# Patient Record
Sex: Male | Born: 1965 | Race: White | Hispanic: No | Smoking: Never smoker
Health system: Southern US, Community
[De-identification: ages and names within clinical notes are randomized; demographics above are authoritative.]

## PROBLEM LIST (undated history)

## (undated) ENCOUNTER — Ambulatory Visit: Disposition: A | Discharge status: Home / Self Care | Payer: Self-pay

## (undated) DIAGNOSIS — K219 Gastro-esophageal reflux disease without esophagitis: Secondary | ICD-10-CM

## (undated) DIAGNOSIS — J45909 Unspecified asthma, uncomplicated: Secondary | ICD-10-CM

## (undated) DIAGNOSIS — K449 Diaphragmatic hernia without obstruction or gangrene: Secondary | ICD-10-CM

## (undated) DIAGNOSIS — F419 Anxiety disorder, unspecified: Secondary | ICD-10-CM

## (undated) DIAGNOSIS — K222 Esophageal obstruction: Secondary | ICD-10-CM

## (undated) DIAGNOSIS — E559 Vitamin D deficiency, unspecified: Secondary | ICD-10-CM

## (undated) DIAGNOSIS — H93A1 Pulsatile tinnitus, right ear: Secondary | ICD-10-CM

## (undated) HISTORY — DX: Diaphragmatic hernia without obstruction or gangrene: K44.9

## (undated) HISTORY — PX: TOOTH EXTRACTION: SUR596

## (undated) HISTORY — DX: Esophageal obstruction: K22.2

## (undated) HISTORY — DX: Gastro-esophageal reflux disease without esophagitis: K21.9

## (undated) HISTORY — DX: Anxiety disorder, unspecified: F41.9

## (undated) HISTORY — PX: WISDOM TOOTH EXTRACTION: SHX21

## (undated) HISTORY — DX: Unspecified asthma, uncomplicated: J45.909

## (undated) HISTORY — DX: Vitamin D deficiency, unspecified: E55.9

## (undated) HISTORY — DX: Pulsatile tinnitus, right ear: H93.A1

---

## 2014-04-19 ENCOUNTER — Ambulatory Visit: Payer: Self-pay | Admitting: Physician Assistant

## 2014-04-27 ENCOUNTER — Ambulatory Visit (INDEPENDENT_AMBULATORY_CARE_PROVIDER_SITE_OTHER): Payer: BC Managed Care – PPO | Admitting: Physician Assistant

## 2014-04-27 ENCOUNTER — Encounter: Payer: Self-pay | Admitting: Physician Assistant

## 2014-04-27 VITALS — BP 149/87 | HR 53 | Temp 98.0°F | Ht 72.0 in | Wt 210.0 lb

## 2014-04-27 DIAGNOSIS — F411 Generalized anxiety disorder: Secondary | ICD-10-CM

## 2014-04-27 DIAGNOSIS — E559 Vitamin D deficiency, unspecified: Secondary | ICD-10-CM

## 2014-04-27 DIAGNOSIS — K219 Gastro-esophageal reflux disease without esophagitis: Secondary | ICD-10-CM

## 2014-04-27 DIAGNOSIS — Z299 Encounter for prophylactic measures, unspecified: Secondary | ICD-10-CM

## 2014-04-27 LAB — COMPREHENSIVE METABOLIC PANEL
ALT: 17 U/L (ref 0–53)
AST: 24 U/L (ref 0–37)
Albumin: 4.5 g/dL (ref 3.5–5.2)
Alkaline Phosphatase: 51 U/L (ref 39–117)
BUN: 15 mg/dL (ref 6–23)
CO2: 29 meq/L (ref 19–32)
CREATININE: 0.95 mg/dL (ref 0.50–1.35)
Calcium: 8.8 mg/dL (ref 8.4–10.5)
Chloride: 102 mEq/L (ref 96–112)
Glucose, Bld: 73 mg/dL (ref 70–99)
POTASSIUM: 4 meq/L (ref 3.5–5.3)
Sodium: 138 mEq/L (ref 135–145)
TOTAL PROTEIN: 6.7 g/dL (ref 6.0–8.3)
Total Bilirubin: 1.6 mg/dL — ABNORMAL HIGH (ref 0.2–1.2)

## 2014-04-27 LAB — TSH: TSH: 2.449 u[IU]/mL (ref 0.350–4.500)

## 2014-04-27 NOTE — Patient Instructions (Signed)
Please continue medications as directed.  I will call you with your lab results.  Please start taking a daily probiotic to help with increased flatulence and occasional heartburn.  Avoid late-night eating.  We will know more once your lab results are in.  Please read the information below on preventive healthcare for men. At some point, I recommend you come in for a full physical.  Preventive Care for Adults A healthy lifestyle and preventive care can promote health and wellness. Preventive health guidelines for men include the following key practices:  A routine yearly physical is a good way to check with your health care provider about your health and preventative screening. It is a chance to share any concerns and updates on your health and to receive a thorough exam.  Visit your dentist for a routine exam and preventative care every 6 months. Brush your teeth twice a day and floss once a day. Good oral hygiene prevents tooth decay and gum disease.  The frequency of eye exams is based on your age, health, family medical history, use of contact lenses, and other factors. Follow your health care provider's recommendations for frequency of eye exams.  Eat a healthy diet. Foods such as vegetables, fruits, whole grains, low-fat dairy products, and lean protein foods contain the nutrients you need without too many calories. Decrease your intake of foods high in solid fats, added sugars, and salt. Eat the right amount of calories for you.Get information about a proper diet from your health care provider, if necessary.  Regular physical exercise is one of the most important things you can do for your health. Most adults should get at least 150 minutes of moderate-intensity exercise (any activity that increases your heart rate and causes you to sweat) each week. In addition, most adults need muscle-strengthening exercises on 2 or more days a week.  Maintain a healthy weight. The body mass index (BMI) is a  screening tool to identify possible weight problems. It provides an estimate of body fat based on height and weight. Your health care provider can find your BMI and can help you achieve or maintain a healthy weight.For adults 20 years and older:  A BMI below 18.5 is considered underweight.  A BMI of 18.5 to 24.9 is normal.  A BMI of 25 to 29.9 is considered overweight.  A BMI of 30 and above is considered obese.  Maintain normal blood lipids and cholesterol levels by exercising and minimizing your intake of saturated fat. Eat a balanced diet with plenty of fruit and vegetables. Blood tests for lipids and cholesterol should begin at age 62 and be repeated every 5 years. If your lipid or cholesterol levels are high, you are over 50, or you are at high risk for heart disease, you may need your cholesterol levels checked more frequently.Ongoing high lipid and cholesterol levels should be treated with medicines if diet and exercise are not working.  If you smoke, find out from your health care provider how to quit. If you do not use tobacco, do not start.  Lung cancer screening is recommended for adults aged 55-80 years who are at high risk for developing lung cancer because of a history of smoking. A yearly low-dose CT scan of the lungs is recommended for people who have at least a 30-pack-year history of smoking and are a current smoker or have quit within the past 15 years. A pack year of smoking is smoking an average of 1 pack of cigarettes a day  for 1 year (for example: 1 pack a day for 30 years or 2 packs a day for 15 years). Yearly screening should continue until the smoker has stopped smoking for at least 15 years. Yearly screening should be stopped for people who develop a health problem that would prevent them from having lung cancer treatment.  If you choose to drink alcohol, do not have more than 2 drinks per day. One drink is considered to be 12 ounces (355 mL) of beer, 5 ounces (148 mL) of  wine, or 1.5 ounces (44 mL) of liquor.  Avoid use of street drugs. Do not share needles with anyone. Ask for help if you need support or instructions about stopping the use of drugs.  High blood pressure causes heart disease and increases the risk of stroke. Your blood pressure should be checked at least every 1-2 years. Ongoing high blood pressure should be treated with medicines, if weight loss and exercise are not effective.  If you are 35-60 years old, ask your health care provider if you should take aspirin to prevent heart disease.  Diabetes screening involves taking a blood sample to check your fasting blood sugar level. This should be done once every 3 years, after age 20, if you are within normal weight and without risk factors for diabetes. Testing should be considered at a younger age or be carried out more frequently if you are overweight and have at least 1 risk factor for diabetes.  Colorectal cancer can be detected and often prevented. Most routine colorectal cancer screening begins at the age of 104 and continues through age 41. However, your health care provider may recommend screening at an earlier age if you have risk factors for colon cancer. On a yearly basis, your health care provider may provide home test kits to check for hidden blood in the stool. Use of a small camera at the end of a tube to directly examine the colon (sigmoidoscopy or colonoscopy) can detect the earliest forms of colorectal cancer. Talk to your health care provider about this at age 68, when routine screening begins. Direct exam of the colon should be repeated every 5-10 years through age 25, unless early forms of precancerous polyps or small growths are found.  People who are at an increased risk for hepatitis B should be screened for this virus. You are considered at high risk for hepatitis B if:  You were born in a country where hepatitis B occurs often. Talk with your health care provider about which  countries are considered high risk.  Your parents were born in a high-risk country and you have not received a shot to protect against hepatitis B (hepatitis B vaccine).  You have HIV or AIDS.  You use needles to inject street drugs.  You live with, or have sex with, someone who has hepatitis B.  You are a man who has sex with other men (MSM).  You get hemodialysis treatment.  You take certain medicines for conditions such as cancer, organ transplantation, and autoimmune conditions.  Hepatitis C blood testing is recommended for all people born from [birth year] through 1965 and any individual with known risks for hepatitis C.  Practice safe sex. Use condoms and avoid high-risk sexual practices to reduce the spread of sexually transmitted infections (STIs). STIs include gonorrhea, chlamydia, syphilis, trichomonas, herpes, HPV, and human immunodeficiency virus (HIV). Herpes, HIV, and HPV are viral illnesses that have no cure. They can result in disability, cancer, and death.  If you  are at risk of being infected with HIV, it is recommended that you take a prescription medicine daily to prevent HIV infection. This is called preexposure prophylaxis (PrEP). You are considered at risk if:  You are a man who has sex with other men (MSM) and have other risk factors.  You are a heterosexual man, are sexually active, and are at increased risk for HIV infection.  You take drugs by injection.  You are sexually active with a partner who has HIV.  Talk with your health care provider about whether you are at high risk of being infected with HIV. If you choose to begin PrEP, you should first be tested for HIV. You should then be tested every 3 months for as long as you are taking PrEP.  A one-time screening for abdominal aortic aneurysm (AAA) and surgical repair of large AAAs by ultrasound are recommended for men ages 61 to 42 years who are current or former smokers.  Healthy men should no longer receive  prostate-specific antigen (PSA) blood tests as part of routine cancer screening. Talk with your health care provider about prostate cancer screening.  Testicular cancer screening is not recommended for adult males who have no symptoms. Screening includes self-exam, a health care provider exam, and other screening tests. Consult with your health care provider about any symptoms you have or any concerns you have about testicular cancer.  Use sunscreen. Apply sunscreen liberally and repeatedly throughout the day. You should seek shade when your shadow is shorter than you. Protect yourself by wearing long sleeves, pants, a wide-brimmed hat, and sunglasses year round, whenever you are outdoors.  Once a month, do a whole-body skin exam, using a mirror to look at the skin on your back. Tell your health care provider about new moles, moles that have irregular borders, moles that are larger than a pencil eraser, or moles that have changed in shape or color.  Stay current with required vaccines (immunizations).  Influenza vaccine. All adults should be immunized every year.  Tetanus, diphtheria, and acellular pertussis (Td, Tdap) vaccine. An adult who has not previously received Tdap or who does not know his vaccine status should receive 1 dose of Tdap. This initial dose should be followed by tetanus and diphtheria toxoids (Td) booster doses every 10 years. Adults with an unknown or incomplete history of completing a 3-dose immunization series with Td-containing vaccines should begin or complete a primary immunization series including a Tdap dose. Adults should receive a Td booster every 10 years.  Varicella vaccine. An adult without evidence of immunity to varicella should receive 2 doses or a second dose if he has previously received 1 dose.  Human papillomavirus (HPV) vaccine. Males aged 60-21 years who have not received the vaccine previously should receive the 3-dose series. Males aged 22-26 years may be  immunized. Immunization is recommended through the age of 43 years for any male who has sex with males and did not get any or all doses earlier. Immunization is recommended for any person with an immunocompromised condition through the age of 34 years if he did not get any or all doses earlier. During the 3-dose series, the second dose should be obtained 4-8 weeks after the first dose. The third dose should be obtained 24 weeks after the first dose and 16 weeks after the second dose.  Zoster vaccine. One dose is recommended for adults aged 32 years or older unless certain conditions are present.  Measles, mumps, and rubella (MMR) vaccine. Adults  born before [birth year] generally are considered immune to measles and mumps. Adults born in [birth year] or later should have 1 or more doses of MMR vaccine unless there is a contraindication to the vaccine or there is laboratory evidence of immunity to each of the three diseases. A routine second dose of MMR vaccine should be obtained at least 28 days after the first dose for students attending postsecondary schools, health care workers, or international travelers. People who received inactivated measles vaccine or an unknown type of measles vaccine during 1963-1967 should receive 2 doses of MMR vaccine. People who received inactivated mumps vaccine or an unknown type of mumps vaccine before 1979 and are at high risk for mumps infection should consider immunization with 2 doses of MMR vaccine. Unvaccinated health care workers born before [birth year] who lack laboratory evidence of measles, mumps, or rubella immunity or laboratory confirmation of disease should consider measles and mumps immunization with 2 doses of MMR vaccine or rubella immunization with 1 dose of MMR vaccine.  Pneumococcal 13-valent conjugate (PCV13) vaccine. When indicated, a person who is uncertain of his immunization history and has no record of immunization should receive the PCV13 vaccine. An adult aged 60 years or  older who has certain medical conditions and has not been previously immunized should receive 1 dose of PCV13 vaccine. This PCV13 should be followed with a dose of pneumococcal polysaccharide (PPSV23) vaccine. The PPSV23 vaccine dose should be obtained at least 8 weeks after the dose of PCV13 vaccine. An adult aged 50 years or older who has certain medical conditions and previously received 1 or more doses of PPSV23 vaccine should receive 1 dose of PCV13. The PCV13 vaccine dose should be obtained 1 or more years after the last PPSV23 vaccine dose.  Pneumococcal polysaccharide (PPSV23) vaccine. When PCV13 is also indicated, PCV13 should be obtained first. All adults aged 38 years and older should be immunized. An adult younger than age 30 years who has certain medical conditions should be immunized. Any person who resides in a nursing home or long-term care facility should be immunized. An adult smoker should be immunized. People with an immunocompromised condition and certain other conditions should receive both PCV13 and PPSV23 vaccines. People with human immunodeficiency virus (HIV) infection should be immunized as soon as possible after diagnosis. Immunization during chemotherapy or radiation therapy should be avoided. Routine use of PPSV23 vaccine is not recommended for American Indians, [place] Natives, or people younger than 65 years unless there are medical conditions that require PPSV23 vaccine. When indicated, people who have unknown immunization and have no record of immunization should receive PPSV23 vaccine. One-time revaccination 5 years after the first dose of PPSV23 is recommended for people aged 19-64 years who have chronic kidney failure, nephrotic syndrome, asplenia, or immunocompromised conditions. People who received 1-2 doses of PPSV23 before age 34 years should receive another dose of PPSV23 vaccine at age 63 years or later if at least 5 years have passed since the previous dose. Doses of  PPSV23 are not needed for people immunized with PPSV23 at or after age 53 years.  Meningococcal vaccine. Adults with asplenia or persistent complement component deficiencies should receive 2 doses of quadrivalent meningococcal conjugate (MenACWY-D) vaccine. The doses should be obtained at least 2 months apart. Microbiologists working with certain meningococcal bacteria, [employer] recruits, people at risk during an outbreak, and people who travel to or live in countries with a high rate of meningitis should be immunized. A first-year college student up through age  21 years who is living in a residence hall should receive a dose if he did not receive a dose on or after his 16th birthday. Adults who have certain high-risk conditions should receive one or more doses of vaccine.  Hepatitis A vaccine. Adults who wish to be protected from this disease, have certain high-risk conditions, work with hepatitis A-infected animals, work in hepatitis A research labs, or travel to or work in countries with a high rate of hepatitis A should be immunized. Adults who were previously unvaccinated and who anticipate close contact with an international adoptee during the first 60 days after arrival in the [place] from a country with a high rate of hepatitis A should be immunized.  Hepatitis B vaccine. Adults should be immunized if they wish to be protected from this disease, have certain high-risk conditions, may be exposed to blood or other infectious body fluids, are household contacts or sex partners of hepatitis B positive people, are clients or workers in certain care facilities, or travel to or work in countries with a high rate of hepatitis B.  Haemophilus influenzae type b (Hib) vaccine. A previously unvaccinated person with asplenia or sickle cell disease or having a scheduled splenectomy should receive 1 dose of Hib vaccine. Regardless of previous immunization, a recipient of a hematopoietic stem cell transplant  should receive a 3-dose series 6-12 months after his successful transplant. Hib vaccine is not recommended for adults with HIV infection. Preventive Service / Frequency Ages 64 to 22  Blood pressure check.** / Every 1 to 2 years.  Lipid and cholesterol check.** / Every 5 years beginning at age 26.  Hepatitis C blood test.** / For any individual with known risks for hepatitis C.  Skin self-exam. / Monthly.  Influenza vaccine. / Every year.  Tetanus, diphtheria, and acellular pertussis (Tdap, Td) vaccine.** / Consult your health care provider. 1 dose of Td every 10 years.  Varicella vaccine.** / Consult your health care provider.  HPV vaccine. / 3 doses over 6 months, if 18 or younger.  Measles, mumps, rubella (MMR) vaccine.** / You need at least 1 dose of MMR if you were born in 1957 or later. You may also need a second dose.  Pneumococcal 13-valent conjugate (PCV13) vaccine.** / Consult your health care provider.  Pneumococcal polysaccharide (PPSV23) vaccine.** / 1 to 2 doses if you smoke cigarettes or if you have certain conditions.  Meningococcal vaccine.** / 1 dose if you are age 51 to 43 years and a Market researcher living in a residence hall, or have one of several medical conditions. You may also need additional booster doses.  Hepatitis A vaccine.** / Consult your health care provider.  Hepatitis B vaccine.** / Consult your health care provider.  Haemophilus influenzae type b (Hib) vaccine.** / Consult your health care provider. Ages 68 to 62  Blood pressure check.** / Every 1 to 2 years.  Lipid and cholesterol check.** / Every 5 years beginning at age 86.  Lung cancer screening. / Every year if you are aged 12-80 years and have a 30-pack-year history of smoking and currently smoke or have quit within the past 15 years. Yearly screening is stopped once you have quit smoking for at least 15 years or develop a health problem that would prevent you from having  lung cancer treatment.  Fecal occult blood test (FOBT) of stool. / Every year beginning at age 20 and continuing until age 70. You may not have to do this test if  you get a colonoscopy every 10 years.  Flexible sigmoidoscopy** or colonoscopy.** / Every 5 years for a flexible sigmoidoscopy or every 10 years for a colonoscopy beginning at age 21 and continuing until age 43.  Hepatitis C blood test.** / For all people born from [birth year] through 1965 and any individual with known risks for hepatitis C.  Skin self-exam. / Monthly.  Influenza vaccine. / Every year.  Tetanus, diphtheria, and acellular pertussis (Tdap/Td) vaccine.** / Consult your health care provider. 1 dose of Td every 10 years.  Varicella vaccine.** / Consult your health care provider.  Zoster vaccine.** / 1 dose for adults aged 69 years or older.  Measles, mumps, rubella (MMR) vaccine.** / You need at least 1 dose of MMR if you were born in 1957 or later. You may also need a second dose.  Pneumococcal 13-valent conjugate (PCV13) vaccine.** / Consult your health care provider.  Pneumococcal polysaccharide (PPSV23) vaccine.** / 1 to 2 doses if you smoke cigarettes or if you have certain conditions.  Meningococcal vaccine.** / Consult your health care provider.  Hepatitis A vaccine.** / Consult your health care provider.  Hepatitis B vaccine.** / Consult your health care provider.  Haemophilus influenzae type b (Hib) vaccine.** / Consult your health care provider. Ages 50 and over  Blood pressure check.** / Every 1 to 2 years.  Lipid and cholesterol check.**/ Every 5 years beginning at age 74.  Lung cancer screening. / Every year if you are aged 18-80 years and have a 30-pack-year history of smoking and currently smoke or have quit within the past 15 years. Yearly screening is stopped once you have quit smoking for at least 15 years or develop a health problem that would prevent you from having lung cancer  treatment.  Fecal occult blood test (FOBT) of stool. / Every year beginning at age 72 and continuing until age 36. You may not have to do this test if you get a colonoscopy every 10 years.  Flexible sigmoidoscopy** or colonoscopy.** / Every 5 years for a flexible sigmoidoscopy or every 10 years for a colonoscopy beginning at age 42 and continuing until age 57.  Hepatitis C blood test.** / For all people born from [birth year] through 1965 and any individual with known risks for hepatitis C.  Abdominal aortic aneurysm (AAA) screening.** / A one-time screening for ages 76 to 54 years who are current or former smokers.  Skin self-exam. / Monthly.  Influenza vaccine. / Every year.  Tetanus, diphtheria, and acellular pertussis (Tdap/Td) vaccine.** / 1 dose of Td every 10 years.  Varicella vaccine.** / Consult your health care provider.  Zoster vaccine.** / 1 dose for adults aged 84 years or older.  Pneumococcal 13-valent conjugate (PCV13) vaccine.** / Consult your health care provider.  Pneumococcal polysaccharide (PPSV23) vaccine.** / 1 dose for all adults aged 76 years and older.  Meningococcal vaccine.** / Consult your health care provider.  Hepatitis A vaccine.** / Consult your health care provider.  Hepatitis B vaccine.** / Consult your health care provider.  Haemophilus influenzae type b (Hib) vaccine.** / Consult your health care provider. **Family history and personal history of risk and conditions may change your health care provider's recommendations. Document Released: 09/29/2001 Document Revised: 08/08/2013 Document Reviewed: 12/29/2010 [facility] Patient Information 2015 [place], [place]. This information is not intended to replace advice given to you by your health care provider. Make sure you discuss any questions you have with your health care provider.

## 2014-04-27 NOTE — Progress Notes (Signed)
Patient presents to clinic today to establish care.  Acute Concerns: Patient wishes to have his blood sugar levels checked. Endorses family history of diabetes.  Patient denies personal history of elevated glucose.  Denies urinary frequency, polydipsia, polyuria or polyphagia.  Denies nausea/vomiting or dizziness.    Chronic Issues: GERD -- well-controlled with current regimen.  Notes recurrence of symptoms if he skips a dose of medication. Denies nausea/vomiting or abdominal pain.  Asthma -- controlled with combination of Advair BID and Albuterol prn.  Denies nighttime symptoms.  Requires Albuterol a few times per week.    Hypothyroidism -- patient endorses history of underactive thyroid diagnosed several years ago.  States he has never been treated for this.  Health Maintenance: Dental -- up-to-date Vision -- up-to-date Immunizations -- Declines flu shot. Endorses Tetanus last in 2012.  Past Medical History  Diagnosis Date  . Asthma     History reviewed. No pertinent past surgical history.  No current outpatient prescriptions on file prior to visit.   No current facility-administered medications on file prior to visit.    Allergies  Allergen Reactions  . Shellfish Allergy     Family History  Problem Relation Age of Onset  . Diabetes Mother   . Hypertension Father   . Heart disease Father   . Diabetes Brother   . Diabetes Maternal Grandmother   . Cancer Paternal Grandfather     Brain Tumor    History   Social History  . Marital Status: Legally Separated    Spouse Name: N/A    Number of Children: N/A  . Years of Education: N/A   Occupational History  . Not on file.   Social History Main Topics  . Smoking status: Never Smoker   . Smokeless tobacco: Not on file  . Alcohol Use: No  . Drug Use: No  . Sexual Activity: No   Other Topics Concern  . Not on file   Social History Narrative  . No narrative on file   ROS See HPI.  All other ROS are  negative.  BP 149/87  Pulse 53  Temp(Src) 98 F (36.7 C)  Ht 6' (1.829 m)  Wt 210 lb (95.255 kg)  BMI 28.47 kg/m2  SpO2 98%  Physical Exam  Vitals reviewed. Constitutional: He is oriented to person, place, and time and well-developed, well-nourished, and in no distress.  HENT:  Head: Normocephalic and atraumatic.  Right Ear: External ear normal.  Left Ear: External ear normal.  Nose: Nose normal.  Mouth/Throat: Oropharynx is clear and moist. No oropharyngeal exudate.  TM within normal limits bilaterally.  Eyes: Conjunctivae are normal. Pupils are equal, round, and reactive to light.  Neck: Neck supple. No thyromegaly present.  Cardiovascular: Normal rate, regular rhythm, normal heart sounds and intact distal pulses.   Pulmonary/Chest: Effort normal and breath sounds normal. No respiratory distress. He has no wheezes. He has no rales. He exhibits no tenderness.  Abdominal: Soft. Bowel sounds are normal. He exhibits no distension and no mass. There is no tenderness. There is no rebound and no guarding.  Lymphadenopathy:    He has no cervical adenopathy.  Neurological: He is alert and oriented to person, place, and time.  Skin: Skin is warm and dry. No rash noted.  Psychiatric: Affect normal.    Recent Results (from the past 2160 hour(s))  CBC WITH DIFFERENTIAL     Status: None   Collection Time    04/27/14  4:23 PM      Result  Value Ref Range   WBC 5.6  4.0 - 10.5 K/uL   RBC 4.44  4.22 - 5.81 MIL/uL   Hemoglobin 14.2  13.0 - 17.0 g/dL   HCT 42.7  39.0 - 52.0 %   MCV 96.2  78.0 - 100.0 fL   MCH 32.0  26.0 - 34.0 pg   MCHC 33.3  30.0 - 36.0 g/dL   RDW 13.4  11.5 - 15.5 %   Platelets 177  150 - 400 K/uL   Neutrophils Relative % 71  43 - 77 %   Neutro Abs 4.0  1.7 - 7.7 K/uL   Lymphocytes Relative 21  12 - 46 %   Lymphs Abs 1.2  0.7 - 4.0 K/uL   Monocytes Relative 5  3 - 12 %   Monocytes Absolute 0.3  0.1 - 1.0 K/uL   Eosinophils Relative 2  0 - 5 %   Eosinophils  Absolute 0.1  0.0 - 0.7 K/uL   Basophils Relative 1  0 - 1 %   Basophils Absolute 0.1  0.0 - 0.1 K/uL   Smear Review Criteria for review not met    COMPREHENSIVE METABOLIC PANEL     Status: Abnormal   Collection Time    04/27/14  4:23 PM      Result Value Ref Range   Sodium 138  135 - 145 mEq/L   Potassium 4.0  3.5 - 5.3 mEq/L   Chloride 102  96 - 112 mEq/L   CO2 29  19 - 32 mEq/L   Glucose, Bld 73  70 - 99 mg/dL   BUN 15  6 - 23 mg/dL   Creat 0.95  0.50 - 1.35 mg/dL   Total Bilirubin 1.6 (*) 0.2 - 1.2 mg/dL   Alkaline Phosphatase 51  39 - 117 U/L   AST 24  0 - 37 U/L   ALT 17  0 - 53 U/L   Total Protein 6.7  6.0 - 8.3 g/dL   Albumin 4.5  3.5 - 5.2 g/dL   Calcium 8.8  8.4 - 10.5 mg/dL  HEMOGLOBIN A1C     Status: None   Collection Time    04/27/14  4:23 PM      Result Value Ref Range   Hemoglobin A1C 5.5  <5.7 %   Comment:                                                                            According to the ADA Clinical Practice Recommendations for 2011, when     HbA1c is used as a screening test:             >=6.5%   Diagnostic of Diabetes Mellitus                (if abnormal result is confirmed)           5.7-6.4%   Increased risk of developing Diabetes Mellitus           References:Diagnosis and Classification of Diabetes Mellitus,Diabetes     IPJA,2505,39(JQBHA 1):S62-S69 and Standards of Medical Care in             Diabetes - 2011,Diabetes Care,2011,34 (Suppl 1):S11-S61.  Mean Plasma Glucose 111  <117 mg/dL  TSH     Status: None   Collection Time    04/27/14  4:23 PM      Result Value Ref Range   TSH 2.449  0.350 - 4.500 uIU/mL  URINALYSIS, ROUTINE W REFLEX MICROSCOPIC     Status: None   Collection Time    04/27/14  4:23 PM      Result Value Ref Range   Color, Urine YELLOW  YELLOW   APPearance CLEAR  CLEAR   Specific Gravity, Urine 1.020  1.005 - 1.030   pH 5.5  5.0 - 8.0   Glucose, UA NEG  NEG mg/dL   Bilirubin Urine NEG  NEG   Ketones, ur NEG   NEG mg/dL   Hgb urine dipstick NEG  NEG   Protein, ur NEG  NEG mg/dL   Urobilinogen, UA 0.2  0.0 - 1.0 mg/dL   Nitrite NEG  NEG   Leukocytes, UA NEG  NEG  VITAMIN D 25 HYDROXY     Status: None   Collection Time    04/27/14  4:23 PM      Result Value Ref Range   Vit D, 25-Hydroxy 39  30 - 89 ng/mL   Comment: This assay accurately quantifies Vitamin D, which is the sum of the     25-Hydroxy forms of Vitamin D2 and D3.  Studies have shown that the     optimum concentration of 25-Hydroxy Vitamin D is 30 ng/mL or higher.      Concentrations of Vitamin D between 20 and 29 ng/mL are considered to     be insufficient and concentrations less than 20 ng/mL are considered     to be deficient for Vitamin D.    Assessment/Plan: Gastroesophageal reflux disease without esophagitis Doing well.  Continue current regimen.  Add daily probiotic to help with bloating and flatulence.  Anxiety state, unspecified Will recheck TSH level.  Patient to give thought to counseling or medications.  Hypovitaminosis D Will check Vitamin D level.  Preventive measure Will check blood glucose level and electrolytes. Will also check CBC to establish baseline.

## 2014-04-27 NOTE — Progress Notes (Signed)
Pre visit review using our clinic review tool, if applicable. No additional management support is needed unless otherwise documented below in the visit note. 

## 2014-04-28 LAB — URINALYSIS, ROUTINE W REFLEX MICROSCOPIC
BILIRUBIN URINE: NEGATIVE
GLUCOSE, UA: NEGATIVE mg/dL
Hgb urine dipstick: NEGATIVE
Ketones, ur: NEGATIVE mg/dL
Leukocytes, UA: NEGATIVE
Nitrite: NEGATIVE
PH: 5.5 (ref 5.0–8.0)
PROTEIN: NEGATIVE mg/dL
Specific Gravity, Urine: 1.02 (ref 1.005–1.030)
Urobilinogen, UA: 0.2 mg/dL (ref 0.0–1.0)

## 2014-04-28 LAB — CBC WITH DIFFERENTIAL/PLATELET
BASOS PCT: 1 % (ref 0–1)
Basophils Absolute: 0.1 10*3/uL (ref 0.0–0.1)
Eosinophils Absolute: 0.1 10*3/uL (ref 0.0–0.7)
Eosinophils Relative: 2 % (ref 0–5)
HCT: 42.7 % (ref 39.0–52.0)
HEMOGLOBIN: 14.2 g/dL (ref 13.0–17.0)
LYMPHS PCT: 21 % (ref 12–46)
Lymphs Abs: 1.2 10*3/uL (ref 0.7–4.0)
MCH: 32 pg (ref 26.0–34.0)
MCHC: 33.3 g/dL (ref 30.0–36.0)
MCV: 96.2 fL (ref 78.0–100.0)
MONO ABS: 0.3 10*3/uL (ref 0.1–1.0)
MONOS PCT: 5 % (ref 3–12)
NEUTROS ABS: 4 10*3/uL (ref 1.7–7.7)
Neutrophils Relative %: 71 % (ref 43–77)
Platelets: 177 10*3/uL (ref 150–400)
RBC: 4.44 MIL/uL (ref 4.22–5.81)
RDW: 13.4 % (ref 11.5–15.5)
WBC: 5.6 10*3/uL (ref 4.0–10.5)

## 2014-04-28 LAB — VITAMIN D 25 HYDROXY (VIT D DEFICIENCY, FRACTURES): VIT D 25 HYDROXY: 39 ng/mL (ref 30–89)

## 2014-04-29 LAB — HEMOGLOBIN A1C
HEMOGLOBIN A1C: 5.5 % (ref <5.7)
Mean Plasma Glucose: 111 mg/dL (ref <117)

## 2014-05-13 DIAGNOSIS — E559 Vitamin D deficiency, unspecified: Secondary | ICD-10-CM | POA: Insufficient documentation

## 2014-05-13 DIAGNOSIS — Z299 Encounter for prophylactic measures, unspecified: Secondary | ICD-10-CM | POA: Insufficient documentation

## 2014-05-13 DIAGNOSIS — F411 Generalized anxiety disorder: Secondary | ICD-10-CM | POA: Insufficient documentation

## 2014-05-13 DIAGNOSIS — K219 Gastro-esophageal reflux disease without esophagitis: Secondary | ICD-10-CM | POA: Insufficient documentation

## 2014-05-13 NOTE — Assessment & Plan Note (Signed)
Will check blood glucose level and electrolytes. Will also check CBC to establish baseline.

## 2014-05-13 NOTE — Assessment & Plan Note (Signed)
Will check Vitamin D level

## 2014-05-13 NOTE — Assessment & Plan Note (Signed)
Will recheck TSH level.  Patient to give thought to counseling or medications.

## 2014-05-13 NOTE — Assessment & Plan Note (Signed)
Doing well.  Continue current regimen.  Add daily probiotic to help with bloating and flatulence.

## 2014-05-16 ENCOUNTER — Encounter: Payer: Self-pay | Admitting: Physician Assistant

## 2014-05-16 ENCOUNTER — Ambulatory Visit (INDEPENDENT_AMBULATORY_CARE_PROVIDER_SITE_OTHER): Payer: BC Managed Care – PPO | Admitting: Physician Assistant

## 2014-05-16 VITALS — BP 128/76 | HR 50 | Temp 97.7°F | Resp 16 | Ht 72.0 in | Wt 209.0 lb

## 2014-05-16 DIAGNOSIS — K219 Gastro-esophageal reflux disease without esophagitis: Secondary | ICD-10-CM

## 2014-05-16 DIAGNOSIS — M79672 Pain in left foot: Secondary | ICD-10-CM

## 2014-05-16 DIAGNOSIS — M79671 Pain in right foot: Secondary | ICD-10-CM

## 2014-05-16 DIAGNOSIS — M79609 Pain in unspecified limb: Secondary | ICD-10-CM

## 2014-05-16 NOTE — Assessment & Plan Note (Signed)
Continue daily probiotic.  Zantac daily.  Avoid trigger foods. Elevate HOB.

## 2014-05-16 NOTE — Progress Notes (Signed)
Pre visit review using our clinic review tool, if applicable. No additional management support is needed unless otherwise documented below in the visit note/SLS  

## 2014-05-16 NOTE — Patient Instructions (Signed)
Please continue probiotic daily. Take a zantac each day before your run.  For allergies -- continue Flonase daily.  Add a daily claritin.  Increase fluid intake.  You will be contacted by the Podiatrist for an appointment.   Follow-up in 3 months.

## 2014-05-16 NOTE — Progress Notes (Signed)
Patient presents to clinic today for follow-up of acid reflux and bloating symptoms.  Patient has been taking a probiotic daily with marked improvement in symptoms.  Bloating has dissipated.  Flatulence much decreased.  Some residual heartburn noted after use of inhalers for asthma. Denies N/V, epigastric pain.  Past Medical History  Diagnosis Date  . Asthma     Current Outpatient Prescriptions on File Prior to Visit  Medication Sig Dispense Refill  . ADVAIR DISKUS 100-50 MCG/DOSE AEPB Inhale 1 puff into the lungs 2 (two) times daily.       . fluticasone (FLONASE) 50 MCG/ACT nasal spray       . VENTOLIN HFA 108 (90 BASE) MCG/ACT inhaler Inhale into the lungs 2 (two) times daily as needed.       . Vitamin D, Ergocalciferol, (DRISDOL) 50000 UNITS CAPS capsule Take 50,000 Units by mouth every 7 (seven) days.        No current facility-administered medications on file prior to visit.    Allergies  Allergen Reactions  . Dust Mite Extract   . Other     Grass, Mold, Mildew  . Shellfish Allergy     Family History  Problem Relation Age of Onset  . Diabetes Mother   . Hypertension Father   . Heart disease Father   . Diabetes Brother   . Diabetes Maternal Grandmother   . Cancer Paternal Grandfather     Brain Tumor    History   Social History  . Marital Status: Legally Separated    Spouse Name: N/A    Number of Children: N/A  . Years of Education: N/A   Social History Main Topics  . Smoking status: Never Smoker   . Smokeless tobacco: None  . Alcohol Use: No  . Drug Use: No  . Sexual Activity: No   Other Topics Concern  . None   Social History Narrative  . None    Review of Systems - See HPI.  All other ROS are negative.  BP 128/76  Pulse 50  Temp(Src) 97.7 F (36.5 C) (Oral)  Resp 16  Ht 6' (1.829 m)  Wt 209 lb (94.802 kg)  BMI 28.34 kg/m2  SpO2 100%  Physical Exam  Vitals reviewed. Constitutional: He is well-developed, well-nourished, and in no  distress.  HENT:  Head: Normocephalic and atraumatic.  Eyes: Conjunctivae are normal.  Cardiovascular: Normal rate, regular rhythm, normal heart sounds and intact distal pulses.   Pulmonary/Chest: Effort normal and breath sounds normal. No respiratory distress. He has no wheezes. He has no rales. He exhibits no tenderness.  Abdominal: Soft. Bowel sounds are normal. He exhibits no distension. There is no tenderness.  Psychiatric: Affect normal.    Recent Results (from the past 2160 hour(s))  CBC WITH DIFFERENTIAL     Status: None   Collection Time    04/27/14  4:23 PM      Result Value Ref Range   WBC 5.6  4.0 - 10.5 K/uL   RBC 4.44  4.22 - 5.81 MIL/uL   Hemoglobin 14.2  13.0 - 17.0 g/dL   HCT 42.7  39.0 - 52.0 %   MCV 96.2  78.0 - 100.0 fL   MCH 32.0  26.0 - 34.0 pg   MCHC 33.3  30.0 - 36.0 g/dL   RDW 13.4  11.5 - 15.5 %   Platelets 177  150 - 400 K/uL   Neutrophils Relative % 71  43 - 77 %   Neutro Abs  4.0  1.7 - 7.7 K/uL   Lymphocytes Relative 21  12 - 46 %   Lymphs Abs 1.2  0.7 - 4.0 K/uL   Monocytes Relative 5  3 - 12 %   Monocytes Absolute 0.3  0.1 - 1.0 K/uL   Eosinophils Relative 2  0 - 5 %   Eosinophils Absolute 0.1  0.0 - 0.7 K/uL   Basophils Relative 1  0 - 1 %   Basophils Absolute 0.1  0.0 - 0.1 K/uL   Smear Review Criteria for review not met    COMPREHENSIVE METABOLIC PANEL     Status: Abnormal   Collection Time    04/27/14  4:23 PM      Result Value Ref Range   Sodium 138  135 - 145 mEq/L   Potassium 4.0  3.5 - 5.3 mEq/L   Chloride 102  96 - 112 mEq/L   CO2 29  19 - 32 mEq/L   Glucose, Bld 73  70 - 99 mg/dL   BUN 15  6 - 23 mg/dL   Creat 0.95  0.50 - 1.35 mg/dL   Total Bilirubin 1.6 (*) 0.2 - 1.2 mg/dL   Alkaline Phosphatase 51  39 - 117 U/L   AST 24  0 - 37 U/L   ALT 17  0 - 53 U/L   Total Protein 6.7  6.0 - 8.3 g/dL   Albumin 4.5  3.5 - 5.2 g/dL   Calcium 8.8  8.4 - 10.5 mg/dL  HEMOGLOBIN A1C     Status: None   Collection Time    04/27/14  4:23 PM       Result Value Ref Range   Hemoglobin A1C 5.5  <5.7 %   Comment:                                                                            According to the ADA Clinical Practice Recommendations for 2011, when     HbA1c is used as a screening test:             >=6.5%   Diagnostic of Diabetes Mellitus                (if abnormal result is confirmed)           5.7-6.4%   Increased risk of developing Diabetes Mellitus           References:Diagnosis and Classification of Diabetes Mellitus,Diabetes     VWUJ,8119,14(NWGNF 1):S62-S69 and Standards of Medical Care in             Diabetes - 2011,Diabetes Care,2011,34 (Suppl 1):S11-S61.         Mean Plasma Glucose 111  <117 mg/dL  TSH     Status: None   Collection Time    04/27/14  4:23 PM      Result Value Ref Range   TSH 2.449  0.350 - 4.500 uIU/mL  URINALYSIS, ROUTINE W REFLEX MICROSCOPIC     Status: None   Collection Time    04/27/14  4:23 PM      Result Value Ref Range   Color, Urine YELLOW  YELLOW   APPearance CLEAR  CLEAR   Specific Gravity, Urine  1.020  1.005 - 1.030   pH 5.5  5.0 - 8.0   Glucose, UA NEG  NEG mg/dL   Bilirubin Urine NEG  NEG   Ketones, ur NEG  NEG mg/dL   Hgb urine dipstick NEG  NEG   Protein, ur NEG  NEG mg/dL   Urobilinogen, UA 0.2  0.0 - 1.0 mg/dL   Nitrite NEG  NEG   Leukocytes, UA NEG  NEG  VITAMIN D 25 HYDROXY     Status: None   Collection Time    04/27/14  4:23 PM      Result Value Ref Range   Vit D, 25-Hydroxy 39  30 - 89 ng/mL   Comment: This assay accurately quantifies Vitamin D, which is the sum of the     25-Hydroxy forms of Vitamin D2 and D3.  Studies have shown that the     optimum concentration of 25-Hydroxy Vitamin D is 30 ng/mL or higher.      Concentrations of Vitamin D between 20 and 29 ng/mL are considered to     be insufficient and concentrations less than 20 ng/mL are considered     to be deficient for Vitamin D.    Assessment/Plan: Gastroesophageal reflux disease without  esophagitis Continue daily probiotic.  Zantac daily.  Avoid trigger foods. Elevate HOB.

## 2014-05-30 ENCOUNTER — Ambulatory Visit (INDEPENDENT_AMBULATORY_CARE_PROVIDER_SITE_OTHER): Payer: BC Managed Care – PPO | Admitting: Podiatry

## 2014-05-30 ENCOUNTER — Encounter: Payer: Self-pay | Admitting: Podiatry

## 2014-05-30 ENCOUNTER — Ambulatory Visit (INDEPENDENT_AMBULATORY_CARE_PROVIDER_SITE_OTHER): Payer: BC Managed Care – PPO

## 2014-05-30 VITALS — BP 108/70 | HR 54 | Resp 16 | Ht 72.0 in | Wt 208.0 lb

## 2014-05-30 DIAGNOSIS — M2040 Other hammer toe(s) (acquired), unspecified foot: Secondary | ICD-10-CM

## 2014-05-30 DIAGNOSIS — M779 Enthesopathy, unspecified: Secondary | ICD-10-CM

## 2014-05-30 NOTE — Progress Notes (Signed)
   Subjective:    Patient ID: Francisco Morrison, male    DOB: 07/23/1966, 48 y.o.   MRN: [id number]  HPI Comments: "I don't have any major problems other than needing new inserts"  Patient would like to get new orthotics. He's had his current ones for 3 years. He is an active runner. He does have a corn on 2nd toe left. Also, sometimes feels a "strain" lateral ankle right.  Foot Pain      Review of Systems  HENT: Positive for sinus pressure.   All other systems reviewed and are negative.      Objective:   Physical Exam        Assessment & Plan:

## 2014-05-31 NOTE — Progress Notes (Signed)
Subjective:     Patient ID: Francisco Morrison, male   DOB: [birth year], 48 y.o.   MRN: [id number]  HPI patient presents stating I'm a runner and my orthotics have worn out and I need new pair   Review of Systems  All other systems reviewed and are negative.      Objective:   Physical Exam  Nursing note and vitals reviewed. Constitutional: He is oriented to person, place, and time.  Cardiovascular: Intact distal pulses.   Musculoskeletal: Normal range of motion.  Neurological: He is oriented to person, place, and time.  Skin: Skin is warm.   neurovascular status intact with muscle strength adequate and range of motion subtalar and midtarsal joint within normal limits. Patient is noted to have moderate depression of the arch and discomfort in the arch of a low-grade nature secondary to activity levels. Digits are well-perfused she is well oriented x3     Assessment:     Moderate plantar fasciitis secondary to foot structure of both feet    Plan:     H&P performed and conditions discussed. Recommended new orthotics which were scanned today and discussed physical therapy and stretching exercises. Reappoint when orthotics are ready

## 2014-06-22 ENCOUNTER — Other Ambulatory Visit: Payer: BC Managed Care – PPO

## 2014-07-03 ENCOUNTER — Other Ambulatory Visit: Payer: Self-pay | Admitting: Physician Assistant

## 2014-07-03 NOTE — Telephone Encounter (Signed)
Rx request to pharmacy/SLS Vit D2 Denied, as patient lab results does not warrant this therapy/SLS

## 2014-07-26 ENCOUNTER — Ambulatory Visit (INDEPENDENT_AMBULATORY_CARE_PROVIDER_SITE_OTHER): Payer: BC Managed Care – PPO | Admitting: Podiatry

## 2014-07-26 ENCOUNTER — Encounter: Payer: Self-pay | Admitting: Podiatry

## 2014-07-26 DIAGNOSIS — M779 Enthesopathy, unspecified: Secondary | ICD-10-CM

## 2014-07-26 NOTE — Patient Instructions (Signed)

## 2014-07-28 NOTE — Progress Notes (Signed)
Subjective:     Patient ID: Francisco Morrison, male   DOB: [birth year], 48 y.o.   MRN: [id number]  HPI patient states my heels are sore but they are improved quite a bit from previous visits   Review of Systems     Objective:   Physical Exam Neurovascular status intact with moderate discomfort plantar aspect heel at the insertional of the tendon into the calcaneus bilateral    Assessment:     Continued plantar fasciitis with moderate depression of the arch as a contributing factor    Plan:     Dispensed orthotics with instructions on usage and reviewed shoe gear modifications and gradual increase in activity. Reappoint in 6 weeks to reevaluate or earlier if symptoms worsen

## 2014-08-15 ENCOUNTER — Encounter: Payer: Self-pay | Admitting: Physician Assistant

## 2014-08-15 ENCOUNTER — Ambulatory Visit (INDEPENDENT_AMBULATORY_CARE_PROVIDER_SITE_OTHER): Payer: BC Managed Care – PPO | Admitting: Physician Assistant

## 2014-08-15 VITALS — BP 125/80 | HR 59 | Temp 98.6°F | Resp 16 | Ht 72.0 in | Wt 213.2 lb

## 2014-08-15 DIAGNOSIS — K219 Gastro-esophageal reflux disease without esophagitis: Secondary | ICD-10-CM

## 2014-08-15 NOTE — Progress Notes (Signed)
   Patient presents to clinic today for follow-up of GERD.  Patient was instructed to start Zantac daily to BID due to GERD symptoms noted after exercise.  Patient states symptoms are unchanged.  Has not taken any of the medication.  Denies nausea/vomiting or abdominal pain.  Thinks his stress level is a factor in his GERD symptoms.  Past Medical History  Diagnosis Date  . Asthma     Current Outpatient Prescriptions on File Prior to Visit  Medication Sig Dispense Refill  . ADVAIR DISKUS 100-50 MCG/DOSE AEPB USE 1 PUFF TWICE DAILY 60 each 2  . albuterol (VENTOLIN HFA) 108 (90 BASE) MCG/ACT inhaler Inhale 1-2 puffs into the lungs every 4 (four) hours as needed for wheezing or shortness of breath (MAXIMUM OF 12 PUFFS PER DAY). 18 g 2  . fluticasone (FLONASE) 50 MCG/ACT nasal spray USE 2 SPRAYS NASALLY DAILY AS NEEDED 16 g 2  . Probiotic Product (PROBIOTIC DAILY) CAPS Take by mouth daily.     No current facility-administered medications on file prior to visit.    Allergies  Allergen Reactions  . Dust Mite Extract   . Other     Grass, Mold, Mildew  . Shellfish Allergy   . Wheat Bran Other (See Comments)    All Wheat Products  . Eggs Or Egg-Derived Products Other (See Comments)    Sensitivity    Family History  Problem Relation Age of Onset  . Diabetes Mother   . Hypertension Father   . Heart disease Father   . Diabetes Brother   . Diabetes Maternal Grandmother   . Cancer Paternal Grandfather     Brain Tumor    History   Social History  . Marital Status: Legally Separated    Spouse Name: N/A    Number of Children: N/A  . Years of Education: N/A   Social History Main Topics  . Smoking status: Never Smoker   . Smokeless tobacco: None  . Alcohol Use: No  . Drug Use: No  . Sexual Activity: No   Other Topics Concern  . None   Social History Narrative   Review of Systems - See HPI.  All other ROS are negative.  BP 125/80 mmHg  Pulse 59  Temp(Src) 98.6 F (37 C)  (Oral)  Resp 16  Ht 6' (1.829 m)  Wt 213 lb 4 oz (96.73 kg)  BMI 28.92 kg/m2  SpO2 98%  Physical Exam  Constitutional: He is oriented to person, place, and time and well-developed, well-nourished, and in no distress.  HENT:  Head: Normocephalic and atraumatic.  Eyes: Conjunctivae are normal.  Neck: Neck supple.  Cardiovascular: Normal rate, regular rhythm, normal heart sounds and intact distal pulses.   Pulmonary/Chest: Effort normal and breath sounds normal. No respiratory distress. He has no wheezes. He has no rales. He exhibits no tenderness.  Abdominal: Soft. Bowel sounds are normal. He exhibits no distension and no mass. There is no tenderness. There is no rebound and no guarding.  Neurological: He is alert and oriented to person, place, and time.  Skin: Skin is warm and dry. No rash noted.  Psychiatric: Affect normal.  Vitals reviewed.  Assessment/Plan: Gastroesophageal reflux disease without esophagitis Noncompliant with instructions.  Begin Zantac as directed.  GERD diet discussed.  Avoid late-night eating.  Follow-up 3 months.

## 2014-08-15 NOTE — Assessment & Plan Note (Signed)
Noncompliant with instructions.  Begin Zantac as directed.  GERD diet discussed.  Avoid late-night eating.  Follow-up 3 months.

## 2014-08-15 NOTE — Patient Instructions (Signed)
Start taking the Zantac as directed -- start with 1 tablet daily.  Then increase to 1 tablet twice daily after a few days.  Limit late-night eating.  Elevate the head of your bed.  Food Choices for Gastroesophageal Reflux Disease When you have gastroesophageal reflux disease (GERD), the foods you eat and your eating habits are very important. Choosing the right foods can help ease the discomfort of GERD. WHAT GENERAL GUIDELINES DO I NEED TO FOLLOW?  Choose fruits, vegetables, whole grains, low-fat dairy products, and low-fat meat, fish, and poultry.  Limit fats such as oils, salad dressings, butter, nuts, and avocado.  Keep a food diary to identify foods that cause symptoms.  Avoid foods that cause reflux. These may be different for different people.  Eat frequent small meals instead of three large meals each day.  Eat your meals slowly, in a relaxed setting.  Limit fried foods.  Cook foods using methods other than frying.  Avoid drinking alcohol.  Avoid drinking large amounts of liquids with your meals.  Avoid bending over or lying down until 2-3 hours after eating. WHAT FOODS ARE NOT RECOMMENDED? The following are some foods and drinks that may worsen your symptoms: Vegetables Tomatoes. Tomato juice. Tomato and spaghetti sauce. Chili peppers. Onion and garlic. Horseradish. Fruits Oranges, grapefruit, and lemon (fruit and juice). Meats High-fat meats, fish, and poultry. This includes hot dogs, ribs, ham, sausage, salami, and bacon. Dairy Whole milk and chocolate milk. Sour cream. Cream. Butter. Ice cream. Cream cheese.  Beverages Coffee and tea, with or without caffeine. Carbonated beverages or energy drinks. Condiments Hot sauce. Barbecue sauce.  Sweets/Desserts Chocolate and cocoa. Donuts. Peppermint and spearmint. Fats and Oils High-fat foods, including [place] fries and potato chips. Other Vinegar. Strong spices, such as black pepper, white pepper, red pepper,  cayenne, curry powder, cloves, ginger, and chili powder. The items listed above may not be a complete list of foods and beverages to avoid. Contact your dietitian for more information. Document Released: 08/03/2005 Document Revised: 08/08/2013 Document Reviewed: 06/07/2013 [facility] Patient Information 2015 [employer] [place]. This information is not intended to replace advice given to you by your health care provider. Make sure you discuss any questions you have with your health care provider.

## 2014-08-15 NOTE — Progress Notes (Signed)
Pre visit review using our clinic review tool, if applicable. No additional management support is needed unless otherwise documented below in the visit note/SLS  

## 2014-09-26 ENCOUNTER — Encounter: Payer: Self-pay | Admitting: Podiatry

## 2014-09-26 ENCOUNTER — Ambulatory Visit (INDEPENDENT_AMBULATORY_CARE_PROVIDER_SITE_OTHER): Payer: BLUE CROSS/BLUE SHIELD | Admitting: Podiatry

## 2014-09-26 VITALS — BP 121/74 | HR 71 | Resp 16

## 2014-09-26 DIAGNOSIS — M722 Plantar fascial fibromatosis: Secondary | ICD-10-CM

## 2014-09-26 NOTE — Progress Notes (Signed)
Subjective:     Patient ID: Francisco Morrison, male   DOB: 1966/04/05, 49 y.o.   MRN: [id number]  HPI patient presents stating I'm doing well with my heels and I really likely orthotics with minimal discomfort noted   Review of Systems     Objective:   Physical Exam Neurovascular status intact with muscle strength adequate range of motion within normal limits. Patient's heels are doing quite well and there is significant reduction of discomfort with palpation    Assessment:     Significant improvement plantar fasciitis bilateral    Plan:     Reviewed continued conservative care and went ahead today and discussed second pair of orthotics which we may make in the future. Reappoint as needed

## 2014-10-08 ENCOUNTER — Encounter: Payer: Self-pay | Admitting: Physician Assistant

## 2014-10-08 ENCOUNTER — Ambulatory Visit (INDEPENDENT_AMBULATORY_CARE_PROVIDER_SITE_OTHER): Payer: BLUE CROSS/BLUE SHIELD | Admitting: Physician Assistant

## 2014-10-08 VITALS — BP 134/67 | HR 57 | Temp 98.2°F | Resp 16 | Ht 72.0 in | Wt 218.0 lb

## 2014-10-08 DIAGNOSIS — R079 Chest pain, unspecified: Secondary | ICD-10-CM

## 2014-10-08 DIAGNOSIS — K219 Gastro-esophageal reflux disease without esophagitis: Secondary | ICD-10-CM

## 2014-10-08 DIAGNOSIS — R0789 Other chest pain: Secondary | ICD-10-CM | POA: Insufficient documentation

## 2014-10-08 LAB — CBC WITH DIFFERENTIAL/PLATELET
BASOS ABS: 0 10*3/uL (ref 0.0–0.1)
BASOS PCT: 0.7 % (ref 0.0–3.0)
EOS ABS: 0 10*3/uL (ref 0.0–0.7)
Eosinophils Relative: 0 % (ref 0.0–5.0)
HCT: 46.4 % (ref 39.0–52.0)
Hemoglobin: 15.9 g/dL (ref 13.0–17.0)
Lymphocytes Relative: 21.3 % (ref 12.0–46.0)
Lymphs Abs: 1.1 10*3/uL (ref 0.7–4.0)
MCHC: 34.3 g/dL (ref 30.0–36.0)
MCV: 95.7 fl (ref 78.0–100.0)
Monocytes Absolute: 0.3 10*3/uL (ref 0.1–1.0)
Monocytes Relative: 6.4 % (ref 3.0–12.0)
Neutro Abs: 3.8 10*3/uL (ref 1.4–7.7)
Neutrophils Relative %: 71.6 % (ref 43.0–77.0)
Platelets: 166 10*3/uL (ref 150.0–400.0)
RBC: 4.85 Mil/uL (ref 4.22–5.81)
RDW: 12.7 % (ref 11.5–15.5)
WBC: 5.3 10*3/uL (ref 4.0–10.5)

## 2014-10-08 MED ORDER — OMEPRAZOLE 20 MG PO CPDR: 20.0000 mg | DELAYED_RELEASE_CAPSULE | Freq: Every day | ORAL | Status: DC

## 2014-10-08 NOTE — Progress Notes (Signed)
Pre visit review using our clinic review tool, if applicable. No additional management support is needed unless otherwise documented below in the visit note/SLS  

## 2014-10-08 NOTE — Patient Instructions (Signed)
Please try to stay calm and remove yourself from major stressors. Please stay well hydrated and eat a well-balanced diet. Take the Prilosec daily as directed.  I will call you with your lab results. Follow-up in 2 weeks.

## 2014-10-08 NOTE — Progress Notes (Signed)
Patient with history of GERD presents to clinic today c/o an episode of epigastric pain last Friday after a meal, lasting 1 minutes before easing off.   Notes that he was sweating a bit when this occurred. Denies recurrence of chest pain.  Endorses stopping his Zantac about 1 week ago because it was only helping some and not completely taking care of his reflux symptoms.  Endorses anxiety has been increased recently due to work stressors.  Was very anxious when symptoms occurred as well.  Patient denies \\palpitations , lightheadedness, dizziness, vision changes or frequent headaches. No hx of HTN.   Past Medical History  Diagnosis Date  . Asthma     Current Outpatient Prescriptions on File Prior to Visit  Medication Sig Dispense Refill  . ADVAIR DISKUS 100-50 MCG/DOSE AEPB USE 1 PUFF TWICE DAILY 60 each 2  . albuterol (VENTOLIN HFA) 108 (90 BASE) MCG/ACT inhaler Inhale 1-2 puffs into the lungs every 4 (four) hours as needed for wheezing or shortness of breath (MAXIMUM OF 12 PUFFS PER DAY). 18 g 2  . fexofenadine (ALLEGRA) 180 MG tablet Take 180 mg by mouth daily as needed for allergies or rhinitis.    . fluticasone (FLONASE) 50 MCG/ACT nasal spray USE 2 SPRAYS NASALLY DAILY AS NEEDED 16 g 2  . Probiotic Product (PROBIOTIC DAILY) CAPS Take by mouth daily.     No current facility-administered medications on file prior to visit.    Allergies  Allergen Reactions  . Dust Mite Extract   . Other     Grass, Mold, Mildew  . Shellfish Allergy   . Wheat Bran Other (See Comments)    All Wheat Products  . Eggs Or Egg-Derived Products Other (See Comments)    Sensitivity    Family History  Problem Relation Age of Onset  . Diabetes Mother   . Hypertension Father   . Heart disease Father   . Diabetes Brother   . Diabetes Maternal Grandmother   . Cancer Paternal Grandfather     Brain Tumor   History   Social History  . Marital Status: Legally Separated    Spouse Name: N/A  . Number of  Children: N/A  . Years of Education: N/A   Social History Main Topics  . Smoking status: Never Smoker   . Smokeless tobacco: Not on file  . Alcohol Use: No  . Drug Use: No  . Sexual Activity: No   Other Topics Concern  . None   Social History Narrative   Review of Systems - See HPI.  All other ROS are negative.  BP 134/67 mmHg  Pulse 57  Temp(Src) 98.2 F (36.8 C) (Oral)  Resp 16  Ht 6' (1.829 m)  Wt 218 lb (98.884 kg)  BMI 29.56 kg/m2  SpO2 99%  Physical Exam  Constitutional: He is oriented to person, place, and time and well-developed, well-nourished, and in no distress.  HENT:  Head: Normocephalic and atraumatic.  Eyes: Conjunctivae are normal. Pupils are equal, round, and reactive to light.  Neck: Neck supple.  Cardiovascular: Normal rate, regular rhythm, normal heart sounds and intact distal pulses.   Pulmonary/Chest: Effort normal and breath sounds normal. No respiratory distress. He has no wheezes. He has no rales. He exhibits no tenderness.  Abdominal: Soft. Bowel sounds are normal. He exhibits no distension and no mass. There is no tenderness. There is no rebound and no guarding.  Lymphadenopathy:    He has no cervical adenopathy.  Neurological: He is alert and  oriented to person, place, and time.  Skin: Skin is warm and dry. No rash noted.  Psychiatric: Affect normal.  Vitals reviewed.  Assessment/Plan: Gastroesophageal reflux disease without esophagitis Only some relief with BID Zantac.  Will initiate Prilosec 20 mg daily. GERD diet discussed.  Stay active.   Atypical chest pain EKG reveals sinus bradycardia.  Asymptomatic.  Exam unremarkable.  Giving history suspect anxiety-related.  Possible GERD component giving history.  Reassurance given.  Will check CBC, CMP and Lipase today. Stress relieving techniques reviewed.  PPI started for GERD.  Follow-up in 2 weeks.

## 2014-10-08 NOTE — Assessment & Plan Note (Signed)
Only some relief with BID Zantac.  Will initiate Prilosec 20 mg daily. GERD diet discussed.  Stay active.

## 2014-10-08 NOTE — Assessment & Plan Note (Signed)
EKG reveals sinus bradycardia.  Asymptomatic.  Exam unremarkable.  Giving history suspect anxiety-related.  Possible GERD component giving history.  Reassurance given.  Will check CBC, CMP and Lipase today. Stress relieving techniques reviewed.  PPI started for GERD.  Follow-up in 2 weeks.

## 2014-10-09 LAB — COMPREHENSIVE METABOLIC PANEL
ALBUMIN: 4.4 g/dL (ref 3.5–5.2)
ALT: 19 U/L (ref 0–53)
AST: 24 U/L (ref 0–37)
Alkaline Phosphatase: 55 U/L (ref 39–117)
BUN: 16 mg/dL (ref 6–23)
CALCIUM: 9.3 mg/dL (ref 8.4–10.5)
CHLORIDE: 105 meq/L (ref 96–112)
CO2: 27 meq/L (ref 19–32)
CREATININE: 0.95 mg/dL (ref 0.40–1.50)
GFR: 89.53 mL/min (ref >60.00)
Glucose, Bld: 85 mg/dL (ref 70–99)
Potassium: 4.7 mEq/L (ref 3.5–5.1)
Sodium: 139 mEq/L (ref 135–145)
Total Bilirubin: 1.5 mg/dL — ABNORMAL HIGH (ref 0.2–1.2)
Total Protein: 7.2 g/dL (ref 6.0–8.3)

## 2014-10-09 LAB — LIPASE: LIPASE: 67 U/L — AB (ref 11.0–59.0)

## 2014-10-17 ENCOUNTER — Telehealth: Payer: Self-pay | Admitting: Physician Assistant

## 2014-10-17 MED ORDER — FLUTICASONE PROPIONATE 50 MCG/ACT NA SUSP: NASAL | Status: DC

## 2014-10-17 NOTE — Telephone Encounter (Signed)
Rx request to pharmacy/[facility]  

## 2014-10-17 NOTE — Telephone Encounter (Signed)
Patient called in also wanting flonase sent

## 2014-10-18 ENCOUNTER — Other Ambulatory Visit (INDEPENDENT_AMBULATORY_CARE_PROVIDER_SITE_OTHER): Payer: BLUE CROSS/BLUE SHIELD

## 2014-10-18 DIAGNOSIS — R748 Abnormal levels of other serum enzymes: Secondary | ICD-10-CM

## 2014-10-18 LAB — LIPASE: Lipase: 46 U/L (ref 11.0–59.0)

## 2014-10-22 ENCOUNTER — Encounter: Payer: Self-pay | Admitting: Physician Assistant

## 2014-10-22 ENCOUNTER — Ambulatory Visit (INDEPENDENT_AMBULATORY_CARE_PROVIDER_SITE_OTHER): Payer: BLUE CROSS/BLUE SHIELD | Admitting: Physician Assistant

## 2014-10-22 VITALS — BP 119/67 | HR 81 | Temp 98.1°F | Resp 16 | Ht 72.0 in | Wt 217.2 lb

## 2014-10-22 DIAGNOSIS — Z114 Encounter for screening for human immunodeficiency virus [HIV]: Secondary | ICD-10-CM

## 2014-10-22 DIAGNOSIS — L989 Disorder of the skin and subcutaneous tissue, unspecified: Secondary | ICD-10-CM

## 2014-10-22 DIAGNOSIS — K219 Gastro-esophageal reflux disease without esophagitis: Secondary | ICD-10-CM

## 2014-10-22 NOTE — Assessment & Plan Note (Signed)
Recommedned per [place]PSTF guidelines.  Patient amenable. No risk factors.  Will obtain today.

## 2014-10-22 NOTE — Patient Instructions (Signed)
I am so glad you are doing well. Please stay active and continue the Prilosec daily.  You will be contacted by Dermatology for assessment. I will call you with your lab results.  Follow-up in 3 months.  Return sooner if needed.

## 2014-10-22 NOTE — Assessment & Plan Note (Signed)
Marked improvement in symptoms with daily Prilosec.  Continue current regimen.

## 2014-10-22 NOTE — Progress Notes (Signed)
Patient presents to clinic today for follow-up of acid reflux after starting Prilosec 20 mg daily. Endorses marked improvement in symptoms, especially after exercise, which is a major change.  Denies chest pain, epigastric pain, nausea or vomiting.  Past Medical History  Diagnosis Date  . Asthma     Current Outpatient Prescriptions on File Prior to Visit  Medication Sig Dispense Refill  . ADVAIR DISKUS 100-50 MCG/DOSE AEPB INHALE 1 PUFF BY MOUTH TWICE DAILY 60 each 2  . albuterol (VENTOLIN HFA) 108 (90 BASE) MCG/ACT inhaler Inhale 1-2 puffs into the lungs every 4 (four) hours as needed for wheezing or shortness of breath (MAXIMUM OF 12 PUFFS PER DAY). 18 g 2  . fexofenadine (ALLEGRA) 180 MG tablet Take 180 mg by mouth daily as needed for allergies or rhinitis.    . fluticasone (FLONASE) 50 MCG/ACT nasal spray USE 2 SPRAYS NASALLY DAILY AS NEEDED 16 g 2  . Probiotic Product (PROBIOTIC DAILY) CAPS Take by mouth daily.    Marland Kitchen omeprazole (PRILOSEC) 20 MG capsule Take 1 capsule (20 mg total) by mouth daily. (Patient not taking: Reported on 10/22/2014) 30 capsule 3   No current facility-administered medications on file prior to visit.    Allergies  Allergen Reactions  . Dust Mite Extract   . Other     Grass, Mold, Mildew  . Shellfish Allergy   . Wheat Bran Other (See Comments)    All Wheat Products  . Eggs Or Egg-Derived Products Other (See Comments)    Sensitivity    Family History  Problem Relation Age of Onset  . Diabetes Mother   . Hypertension Father   . Heart disease Father   . Diabetes Brother   . Diabetes Maternal Grandmother   . Cancer Paternal Grandfather     Brain Tumor    History   Social History  . Marital Status: Legally Separated    Spouse Name: N/A  . Number of Children: N/A  . Years of Education: N/A   Social History Main Topics  . Smoking status: Never Smoker   . Smokeless tobacco: Not on file  . Alcohol Use: No  . Drug Use: No  . Sexual Activity:  No   Other Topics Concern  . None   Social History Narrative   Review of Systems - See HPI.  All other ROS are negative.  BP 119/67 mmHg  Pulse 81  Temp(Src) 98.1 F (36.7 C) (Oral)  Resp 16  Ht 6' (1.829 m)  Wt 217 lb 3.2 oz (98.521 kg)  BMI 29.45 kg/m2  SpO2 95%  Physical Exam  Constitutional: He is oriented to person, place, and time and well-developed, well-nourished, and in no distress.  HENT:  Head: Normocephalic and atraumatic.  Eyes: Conjunctivae are normal.  Cardiovascular: Normal rate, regular rhythm, normal heart sounds and intact distal pulses.   Pulmonary/Chest: Effort normal and breath sounds normal. No respiratory distress. He has no wheezes. He has no rales. He exhibits no tenderness.  Neurological: He is alert and oriented to person, place, and time.  Skin: Skin is warm and dry. No rash noted.  Psychiatric: Affect normal.  Vitals reviewed.  Recent Results (from the past 2160 hour(s))  CBC w/Diff     Status: None   Collection Time: 10/08/14  2:15 PM  Result Value Ref Range   WBC 5.3 4.0 - 10.5 K/uL   RBC 4.85 4.22 - 5.81 Mil/uL   Hemoglobin 15.9 13.0 - 17.0 g/dL   HCT 46.4  39.0 - 52.0 %   MCV 95.7 78.0 - 100.0 fl   MCHC 34.3 30.0 - 36.0 g/dL   RDW 12.7 11.5 - 15.5 %   Platelets 166.0 150.0 - 400.0 K/uL   Neutrophils Relative % 71.6 43.0 - 77.0 %   Lymphocytes Relative 21.3 12.0 - 46.0 %   Monocytes Relative 6.4 3.0 - 12.0 %   Eosinophils Relative 0.0 0.0 - 5.0 %   Basophils Relative 0.7 0.0 - 3.0 %   Neutro Abs 3.8 1.4 - 7.7 K/uL   Lymphs Abs 1.1 0.7 - 4.0 K/uL   Monocytes Absolute 0.3 0.1 - 1.0 K/uL   Eosinophils Absolute 0.0 0.0 - 0.7 K/uL   Basophils Absolute 0.0 0.0 - 0.1 K/uL  Lipase     Status: Abnormal   Collection Time: 10/08/14  2:15 PM  Result Value Ref Range   Lipase 67.0 (H) 11.0 - 59.0 U/L  Comp Met (CMET)     Status: Abnormal   Collection Time: 10/08/14  2:15 PM  Result Value Ref Range   Sodium 139 135 - 145 mEq/L   Potassium  4.7 3.5 - 5.1 mEq/L   Chloride 105 96 - 112 mEq/L   CO2 27 19 - 32 mEq/L   Glucose, Bld 85 70 - 99 mg/dL   BUN 16 6 - 23 mg/dL   Creatinine, Ser 0.95 0.40 - 1.50 mg/dL   Total Bilirubin 1.5 (H) 0.2 - 1.2 mg/dL   Alkaline Phosphatase 55 39 - 117 U/L   AST 24 0 - 37 U/L   ALT 19 0 - 53 U/L   Total Protein 7.2 6.0 - 8.3 g/dL   Albumin 4.4 3.5 - 5.2 g/dL   Calcium 9.3 8.4 - 10.5 mg/dL   GFR 89.53 >60.00 mL/min  Lipase     Status: None   Collection Time: 10/18/14  8:41 AM  Result Value Ref Range   Lipase 46.0 11.0 - 59.0 U/L    Assessment/Plan: Gastroesophageal reflux disease without esophagitis Marked improvement in symptoms with daily Prilosec.  Continue current regimen.   Screening for HIV without presence of risk factors Recommedned per USPSTF guidelines.  Patient amenable. No risk factors.  Will obtain today.

## 2014-10-22 NOTE — Progress Notes (Signed)
Pre visit review using our clinic review tool, if applicable. No additional management support is needed unless otherwise documented below in the visit note. 

## 2014-10-23 LAB — HIV ANTIBODY (ROUTINE TESTING W REFLEX): HIV 1&2 Ab, 4th Generation: NONREACTIVE

## 2014-11-06 ENCOUNTER — Telehealth: Payer: Self-pay

## 2014-11-06 NOTE — Telephone Encounter (Signed)
See speciality notes 

## 2014-11-07 ENCOUNTER — Ambulatory Visit (INDEPENDENT_AMBULATORY_CARE_PROVIDER_SITE_OTHER): Payer: BLUE CROSS/BLUE SHIELD | Admitting: Physician Assistant

## 2014-11-07 ENCOUNTER — Encounter: Payer: Self-pay | Admitting: Physician Assistant

## 2014-11-07 VITALS — BP 123/71 | HR 62 | Temp 98.0°F | Resp 18 | Ht 72.0 in | Wt 214.0 lb

## 2014-11-07 DIAGNOSIS — Z0000 Encounter for general adult medical examination without abnormal findings: Secondary | ICD-10-CM | POA: Insufficient documentation

## 2014-11-07 DIAGNOSIS — K219 Gastro-esophageal reflux disease without esophagitis: Secondary | ICD-10-CM

## 2014-11-07 LAB — URINALYSIS, ROUTINE W REFLEX MICROSCOPIC
Bilirubin Urine: NEGATIVE
Hgb urine dipstick: NEGATIVE
Ketones, ur: NEGATIVE
Leukocytes, UA: NEGATIVE
NITRITE: NEGATIVE
Specific Gravity, Urine: 1.025 (ref 1.000–1.030)
TOTAL PROTEIN, URINE-UPE24: NEGATIVE
Urine Glucose: NEGATIVE
Urobilinogen, UA: 0.2 (ref 0.0–1.0)
pH: 6 (ref 5.0–8.0)

## 2014-11-07 LAB — CBC
HCT: 44.9 % (ref 39.0–52.0)
HEMOGLOBIN: 15.7 g/dL (ref 13.0–17.0)
MCHC: 34.9 g/dL (ref 30.0–36.0)
MCV: 94.4 fl (ref 78.0–100.0)
PLATELETS: 171 10*3/uL (ref 150.0–400.0)
RBC: 4.75 Mil/uL (ref 4.22–5.81)
RDW: 12.7 % (ref 11.5–15.5)
WBC: 5 10*3/uL (ref 4.0–10.5)

## 2014-11-07 LAB — COMPREHENSIVE METABOLIC PANEL
ALT: 20 U/L (ref 0–53)
AST: 23 U/L (ref 0–37)
Albumin: 4.2 g/dL (ref 3.5–5.2)
Alkaline Phosphatase: 50 U/L (ref 39–117)
BILIRUBIN TOTAL: 1.5 mg/dL — AB (ref 0.2–1.2)
BUN: 15 mg/dL (ref 6–23)
CO2: 27 meq/L (ref 19–32)
CREATININE: 0.97 mg/dL (ref 0.40–1.50)
Calcium: 9 mg/dL (ref 8.4–10.5)
Chloride: 106 mEq/L (ref 96–112)
GFR: 87.38 mL/min (ref >60.00)
Glucose, Bld: 99 mg/dL (ref 70–99)
Potassium: 3.9 mEq/L (ref 3.5–5.1)
Sodium: 138 mEq/L (ref 135–145)
Total Protein: 6.7 g/dL (ref 6.0–8.3)

## 2014-11-07 LAB — LIPID PANEL
CHOL/HDL RATIO: 4
Cholesterol: 154 mg/dL (ref 0–200)
HDL: 43 mg/dL (ref >39.00)
LDL CALC: 94 mg/dL (ref 0–99)
NonHDL: 111
TRIGLYCERIDES: 83 mg/dL (ref 0.0–149.0)
VLDL: 16.6 mg/dL (ref 0.0–40.0)

## 2014-11-07 LAB — HEMOGLOBIN A1C: Hgb A1c MFr Bld: 5.5 % (ref 4.6–6.5)

## 2014-11-07 LAB — TSH: TSH: 2.73 u[IU]/mL (ref 0.35–4.50)

## 2014-11-07 NOTE — Assessment & Plan Note (Signed)
I have reviewed the patient's medical history in detail and updated the computerized patient record.  Health Maintenance up-to-date.  Will be due for colonoscopy and PSA next year.  Depression screen performed with score of 0.  Preventive care discussed.  Handout given.  Will obtain fasting labs today.

## 2014-11-07 NOTE — Patient Instructions (Signed)
I am glad you are doing so well. Continue your medications as directed. Stop by the lab for blood work. I will call you with your results. I will fax in your wellness form once complete.  Follow-up will be based on results.  Preventive Care for Adults A healthy lifestyle and preventive care can promote health and wellness. Preventive health guidelines for men include the following key practices:  A routine yearly physical is a good way to check with your health care provider about your health and preventative screening. It is a chance to share any concerns and updates on your health and to receive a thorough exam.  Visit your dentist for a routine exam and preventative care every 6 months. Brush your teeth twice a day and floss once a day. Good oral hygiene prevents tooth decay and gum disease.  The frequency of eye exams is based on your age, health, family medical history, use of contact lenses, and other factors. Follow your health care provider's recommendations for frequency of eye exams.  Eat a healthy diet. Foods such as vegetables, fruits, whole grains, low-fat dairy products, and lean protein foods contain the nutrients you need without too many calories. Decrease your intake of foods high in solid fats, added sugars, and salt. Eat the right amount of calories for you.Get information about a proper diet from your health care provider, if necessary.  Regular physical exercise is one of the most important things you can do for your health. Most adults should get at least 150 minutes of moderate-intensity exercise (any activity that increases your heart rate and causes you to sweat) each week. In addition, most adults need muscle-strengthening exercises on 2 or more days a week.  Maintain a healthy weight. The body mass index (BMI) is a screening tool to identify possible weight problems. It provides an estimate of body fat based on height and weight. Your health care provider can find  your BMI and can help you achieve or maintain a healthy weight.For adults 20 years and older:  A BMI below 18.5 is considered underweight.  A BMI of 18.5 to 24.9 is normal.  A BMI of 25 to 29.9 is considered overweight.  A BMI of 30 and above is considered obese.  Maintain normal blood lipids and cholesterol levels by exercising and minimizing your intake of saturated fat. Eat a balanced diet with plenty of fruit and vegetables. Blood tests for lipids and cholesterol should begin at age 63 and be repeated every 5 years. If your lipid or cholesterol levels are high, you are over 50, or you are at high risk for heart disease, you may need your cholesterol levels checked more frequently.Ongoing high lipid and cholesterol levels should be treated with medicines if diet and exercise are not working.  If you smoke, find out from your health care provider how to quit. If you do not use tobacco, do not start.  Lung cancer screening is recommended for adults aged 78-80 years who are at high risk for developing lung cancer because of a history of smoking. A yearly low-dose CT scan of the lungs is recommended for people who have at least a 30-pack-year history of smoking and are a current smoker or have quit within the past 15 years. A pack year of smoking is smoking an average of 1 pack of cigarettes a day for 1 year (for example: 1 pack a day for 30 years or 2 packs a day for 15 years). Yearly screening should continue  until the smoker has stopped smoking for at least 15 years. Yearly screening should be stopped for people who develop a health problem that would prevent them from having lung cancer treatment.  If you choose to drink alcohol, do not have more than 2 drinks per day. One drink is considered to be 12 ounces (355 mL) of beer, 5 ounces (148 mL) of wine, or 1.5 ounces (44 mL) of liquor.  Avoid use of street drugs. Do not share needles with anyone. Ask for help if you need support or instructions  about stopping the use of drugs.  High blood pressure causes heart disease and increases the risk of stroke. Your blood pressure should be checked at least every 1-2 years. Ongoing high blood pressure should be treated with medicines, if weight loss and exercise are not effective.  If you are 62-84 years old, ask your health care provider if you should take aspirin to prevent heart disease.  Diabetes screening involves taking a blood sample to check your fasting blood sugar level. This should be done once every 3 years, after age 80, if you are within normal weight and without risk factors for diabetes. Testing should be considered at a younger age or be carried out more frequently if you are overweight and have at least 1 risk factor for diabetes.  Colorectal cancer can be detected and often prevented. Most routine colorectal cancer screening begins at the age of 30 and continues through age 66. However, your health care provider may recommend screening at an earlier age if you have risk factors for colon cancer. On a yearly basis, your health care provider may provide home test kits to check for hidden blood in the stool. Use of a small camera at the end of a tube to directly examine the colon (sigmoidoscopy or colonoscopy) can detect the earliest forms of colorectal cancer. Talk to your health care provider about this at age 26, when routine screening begins. Direct exam of the colon should be repeated every 5-10 years through age 54, unless early forms of precancerous polyps or small growths are found.  People who are at an increased risk for hepatitis B should be screened for this virus. You are considered at high risk for hepatitis B if:  You were born in a country where hepatitis B occurs often. Talk with your health care provider about which countries are considered high risk.  Your parents were born in a high-risk country and you have not received a shot to protect against hepatitis B  (hepatitis B vaccine).  You have HIV or AIDS.  You use needles to inject street drugs.  You live with, or have sex with, someone who has hepatitis B.  You are a man who has sex with other men (MSM).  You get hemodialysis treatment.  You take certain medicines for conditions such as cancer, organ transplantation, and autoimmune conditions.  Hepatitis C blood testing is recommended for all people born from [birth year] through 1965 and any individual with known risks for hepatitis C.  Practice safe sex. Use condoms and avoid high-risk sexual practices to reduce the spread of sexually transmitted infections (STIs). STIs include gonorrhea, chlamydia, syphilis, trichomonas, herpes, HPV, and human immunodeficiency virus (HIV). Herpes, HIV, and HPV are viral illnesses that have no cure. They can result in disability, cancer, and death.  If you are at risk of being infected with HIV, it is recommended that you take a prescription medicine daily to prevent HIV infection. This is  called preexposure prophylaxis (PrEP). You are considered at risk if:  You are a man who has sex with other men (MSM) and have other risk factors.  You are a heterosexual man, are sexually active, and are at increased risk for HIV infection.  You take drugs by injection.  You are sexually active with a partner who has HIV.  Talk with your health care provider about whether you are at high risk of being infected with HIV. If you choose to begin PrEP, you should first be tested for HIV. You should then be tested every 3 months for as long as you are taking PrEP.  A one-time screening for abdominal aortic aneurysm (AAA) and surgical repair of large AAAs by ultrasound are recommended for men ages 61 to 74 years who are current or former smokers.  Healthy men should no longer receive prostate-specific antigen (PSA) blood tests as part of routine cancer screening. Talk with your health care provider about prostate cancer  screening.  Testicular cancer screening is not recommended for adult males who have no symptoms. Screening includes self-exam, a health care provider exam, and other screening tests. Consult with your health care provider about any symptoms you have or any concerns you have about testicular cancer.  Use sunscreen. Apply sunscreen liberally and repeatedly throughout the day. You should seek shade when your shadow is shorter than you. Protect yourself by wearing long sleeves, pants, a wide-brimmed hat, and sunglasses year round, whenever you are outdoors.  Once a month, do a whole-body skin exam, using a mirror to look at the skin on your back. Tell your health care provider about new moles, moles that have irregular borders, moles that are larger than a pencil eraser, or moles that have changed in shape or color.  Stay current with required vaccines (immunizations).  Influenza vaccine. All adults should be immunized every year.  Tetanus, diphtheria, and acellular pertussis (Td, Tdap) vaccine. An adult who has not previously received Tdap or who does not know his vaccine status should receive 1 dose of Tdap. This initial dose should be followed by tetanus and diphtheria toxoids (Td) booster doses every 10 years. Adults with an unknown or incomplete history of completing a 3-dose immunization series with Td-containing vaccines should begin or complete a primary immunization series including a Tdap dose. Adults should receive a Td booster every 10 years.  Varicella vaccine. An adult without evidence of immunity to varicella should receive 2 doses or a second dose if he has previously received 1 dose.  Human papillomavirus (HPV) vaccine. Males aged 97-21 years who have not received the vaccine previously should receive the 3-dose series. Males aged 22-26 years may be immunized. Immunization is recommended through the age of 50 years for any male who has sex with males and did not get any or all doses  earlier. Immunization is recommended for any person with an immunocompromised condition through the age of 70 years if he did not get any or all doses earlier. During the 3-dose series, the second dose should be obtained 4-8 weeks after the first dose. The third dose should be obtained 24 weeks after the first dose and 16 weeks after the second dose.  Zoster vaccine. One dose is recommended for adults aged 8 years or older unless certain conditions are present.  Measles, mumps, and rubella (MMR) vaccine. Adults born before [birth year] generally are considered immune to measles and mumps. Adults born in [birth year] or later should have 1 or more doses of  MMR vaccine unless there is a contraindication to the vaccine or there is laboratory evidence of immunity to each of the three diseases. A routine second dose of MMR vaccine should be obtained at least 28 days after the first dose for students attending postsecondary schools, health care workers, or international travelers. People who received inactivated measles vaccine or an unknown type of measles vaccine during 1963-1967 should receive 2 doses of MMR vaccine. People who received inactivated mumps vaccine or an unknown type of mumps vaccine before 1979 and are at high risk for mumps infection should consider immunization with 2 doses of MMR vaccine. Unvaccinated health care workers born before [birth year] who lack laboratory evidence of measles, mumps, or rubella immunity or laboratory confirmation of disease should consider measles and mumps immunization with 2 doses of MMR vaccine or rubella immunization with 1 dose of MMR vaccine.  Pneumococcal 13-valent conjugate (PCV13) vaccine. When indicated, a person who is uncertain of his immunization history and has no record of immunization should receive the PCV13 vaccine. An adult aged 45 years or older who has certain medical conditions and has not been previously immunized should receive 1 dose of PCV13 vaccine. This PCV13  should be followed with a dose of pneumococcal polysaccharide (PPSV23) vaccine. The PPSV23 vaccine dose should be obtained at least 8 weeks after the dose of PCV13 vaccine. An adult aged 5 years or older who has certain medical conditions and previously received 1 or more doses of PPSV23 vaccine should receive 1 dose of PCV13. The PCV13 vaccine dose should be obtained 1 or more years after the last PPSV23 vaccine dose.  Pneumococcal polysaccharide (PPSV23) vaccine. When PCV13 is also indicated, PCV13 should be obtained first. All adults aged 70 years and older should be immunized. An adult younger than age 5 years who has certain medical conditions should be immunized. Any person who resides in a nursing home or long-term care facility should be immunized. An adult smoker should be immunized. People with an immunocompromised condition and certain other conditions should receive both PCV13 and PPSV23 vaccines. People with human immunodeficiency virus (HIV) infection should be immunized as soon as possible after diagnosis. Immunization during chemotherapy or radiation therapy should be avoided. Routine use of PPSV23 vaccine is not recommended for American Indians, [place] Natives, or people younger than 65 years unless there are medical conditions that require PPSV23 vaccine. When indicated, people who have unknown immunization and have no record of immunization should receive PPSV23 vaccine. One-time revaccination 5 years after the first dose of PPSV23 is recommended for people aged 19-64 years who have chronic kidney failure, nephrotic syndrome, asplenia, or immunocompromised conditions. People who received 1-2 doses of PPSV23 before age 40 years should receive another dose of PPSV23 vaccine at age 13 years or later if at least 5 years have passed since the previous dose. Doses of PPSV23 are not needed for people immunized with PPSV23 at or after age 1 years.  Meningococcal vaccine. Adults with asplenia or  persistent complement component deficiencies should receive 2 doses of quadrivalent meningococcal conjugate (MenACWY-D) vaccine. The doses should be obtained at least 2 months apart. Microbiologists working with certain meningococcal bacteria, [employer] recruits, people at risk during an outbreak, and people who travel to or live in countries with a high rate of meningitis should be immunized. A first-year college student up through age 48 years who is living in a residence hall should receive a dose if he did not receive a dose on or after his  16th birthday. Adults who have certain high-risk conditions should receive one or more doses of vaccine.  Hepatitis A vaccine. Adults who wish to be protected from this disease, have certain high-risk conditions, work with hepatitis A-infected animals, work in hepatitis A research labs, or travel to or work in countries with a high rate of hepatitis A should be immunized. Adults who were previously unvaccinated and who anticipate close contact with an international adoptee during the first 60 days after arrival in the [place] from a country with a high rate of hepatitis A should be immunized.  Hepatitis B vaccine. Adults should be immunized if they wish to be protected from this disease, have certain high-risk conditions, may be exposed to blood or other infectious body fluids, are household contacts or sex partners of hepatitis B positive people, are clients or workers in certain care facilities, or travel to or work in countries with a high rate of hepatitis B.  Haemophilus influenzae type b (Hib) vaccine. A previously unvaccinated person with asplenia or sickle cell disease or having a scheduled splenectomy should receive 1 dose of Hib vaccine. Regardless of previous immunization, a recipient of a hematopoietic stem cell transplant should receive a 3-dose series 6-12 months after his successful transplant. Hib vaccine is not recommended for adults with HIV  infection. Preventive Service / Frequency Ages 45 to 60  Blood pressure check.** / Every 1 to 2 years.  Lipid and cholesterol check.** / Every 5 years beginning at age 40.  Hepatitis C blood test.** / For any individual with known risks for hepatitis C.  Skin self-exam. / Monthly.  Influenza vaccine. / Every year.  Tetanus, diphtheria, and acellular pertussis (Tdap, Td) vaccine.** / Consult your health care provider. 1 dose of Td every 10 years.  Varicella vaccine.** / Consult your health care provider.  HPV vaccine. / 3 doses over 6 months, if 76 or younger.  Measles, mumps, rubella (MMR) vaccine.** / You need at least 1 dose of MMR if you were born in 1957 or later. You may also need a second dose.  Pneumococcal 13-valent conjugate (PCV13) vaccine.** / Consult your health care provider.  Pneumococcal polysaccharide (PPSV23) vaccine.** / 1 to 2 doses if you smoke cigarettes or if you have certain conditions.  Meningococcal vaccine.** / 1 dose if you are age 58 to 75 years and a Market researcher living in a residence hall, or have one of several medical conditions. You may also need additional booster doses.  Hepatitis A vaccine.** / Consult your health care provider.  Hepatitis B vaccine.** / Consult your health care provider.  Haemophilus influenzae type b (Hib) vaccine.** / Consult your health care provider. Ages 109 to 55  Blood pressure check.** / Every 1 to 2 years.  Lipid and cholesterol check.** / Every 5 years beginning at age 36.  Lung cancer screening. / Every year if you are aged 64-80 years and have a 30-pack-year history of smoking and currently smoke or have quit within the past 15 years. Yearly screening is stopped once you have quit smoking for at least 15 years or develop a health problem that would prevent you from having lung cancer treatment.  Fecal occult blood test (FOBT) of stool. / Every year beginning at age 93 and continuing until age 50.  You may not have to do this test if you get a colonoscopy every 10 years.  Flexible sigmoidoscopy** or colonoscopy.** / Every 5 years for a flexible sigmoidoscopy or every 10 years  for a colonoscopy beginning at age 48 and continuing until age 87.  Hepatitis C blood test.** / For all people born from [birth year] through 1965 and any individual with known risks for hepatitis C.  Skin self-exam. / Monthly.  Influenza vaccine. / Every year.  Tetanus, diphtheria, and acellular pertussis (Tdap/Td) vaccine.** / Consult your health care provider. 1 dose of Td every 10 years.  Varicella vaccine.** / Consult your health care provider.  Zoster vaccine.** / 1 dose for adults aged 31 years or older.  Measles, mumps, rubella (MMR) vaccine.** / You need at least 1 dose of MMR if you were born in 1957 or later. You may also need a second dose.  Pneumococcal 13-valent conjugate (PCV13) vaccine.** / Consult your health care provider.  Pneumococcal polysaccharide (PPSV23) vaccine.** / 1 to 2 doses if you smoke cigarettes or if you have certain conditions.  Meningococcal vaccine.** / Consult your health care provider.  Hepatitis A vaccine.** / Consult your health care provider.  Hepatitis B vaccine.** / Consult your health care provider.  Haemophilus influenzae type b (Hib) vaccine.** / Consult your health care provider. Ages 78 and over  Blood pressure check.** / Every 1 to 2 years.  Lipid and cholesterol check.**/ Every 5 years beginning at age 71.  Lung cancer screening. / Every year if you are aged 58-80 years and have a 30-pack-year history of smoking and currently smoke or have quit within the past 15 years. Yearly screening is stopped once you have quit smoking for at least 15 years or develop a health problem that would prevent you from having lung cancer treatment.  Fecal occult blood test (FOBT) of stool. / Every year beginning at age 28 and continuing until age 27. You may not have to do this  test if you get a colonoscopy every 10 years.  Flexible sigmoidoscopy** or colonoscopy.** / Every 5 years for a flexible sigmoidoscopy or every 10 years for a colonoscopy beginning at age 67 and continuing until age 82.  Hepatitis C blood test.** / For all people born from [birth year] through 1965 and any individual with known risks for hepatitis C.  Abdominal aortic aneurysm (AAA) screening.** / A one-time screening for ages 15 to 72 years who are current or former smokers.  Skin self-exam. / Monthly.  Influenza vaccine. / Every year.  Tetanus, diphtheria, and acellular pertussis (Tdap/Td) vaccine.** / 1 dose of Td every 10 years.  Varicella vaccine.** / Consult your health care provider.  Zoster vaccine.** / 1 dose for adults aged 46 years or older.  Pneumococcal 13-valent conjugate (PCV13) vaccine.** / Consult your health care provider.  Pneumococcal polysaccharide (PPSV23) vaccine.** / 1 dose for all adults aged 55 years and older.  Meningococcal vaccine.** / Consult your health care provider.  Hepatitis A vaccine.** / Consult your health care provider.  Hepatitis B vaccine.** / Consult your health care provider.  Haemophilus influenzae type b (Hib) vaccine.** / Consult your health care provider. **Family history and personal history of risk and conditions may change your health care provider's recommendations. Document Released: 09/29/2001 Document Revised: 08/08/2013 Document Reviewed: 12/29/2010 [facility] Patient Information 2015 [place], [place]. This information is not intended to replace advice given to you by your health care provider. Make sure you discuss any questions you have with your health care provider.

## 2014-11-07 NOTE — Assessment & Plan Note (Signed)
Well-controlled on current regimen.  Will continue the same.  Follow-up in 6 months.

## 2014-11-07 NOTE — Progress Notes (Signed)
Pre visit review using our clinic review tool, if applicable. No additional management support is needed unless otherwise documented below in the visit note. 

## 2014-11-07 NOTE — Progress Notes (Signed)
Patient presents to clinic today for annual exam.  Patient is fasting for labs.  Acute Concerns: No acute concerns today.  Chronic Issues: GERD -- Well controlled with daily Prilosec.  Is exercising daily.  Is watching diet.  Avoiding late-night eating.   Health Maintenance: Dental -- up-to-date Vision -- up-to-date Immunizations -- up-to-date  Past Medical History  Diagnosis Date  . Asthma     History reviewed. No pertinent past surgical history.  Current Outpatient Prescriptions on File Prior to Visit  Medication Sig Dispense Refill  . ADVAIR DISKUS 100-50 MCG/DOSE AEPB INHALE 1 PUFF BY MOUTH TWICE DAILY 60 each 2  . albuterol (VENTOLIN HFA) 108 (90 BASE) MCG/ACT inhaler Inhale 1-2 puffs into the lungs every 4 (four) hours as needed for wheezing or shortness of breath (MAXIMUM OF 12 PUFFS PER DAY). 18 g 2  . fexofenadine (ALLEGRA) 180 MG tablet Take 180 mg by mouth daily as needed for allergies or rhinitis.    . fluticasone (FLONASE) 50 MCG/ACT nasal spray USE 2 SPRAYS NASALLY DAILY AS NEEDED 16 g 2  . omeprazole (PRILOSEC) 20 MG capsule Take 1 capsule (20 mg total) by mouth daily. 30 capsule 3  . Probiotic Product (PROBIOTIC DAILY) CAPS Take by mouth daily.     No current facility-administered medications on file prior to visit.    Allergies  Allergen Reactions  . Dust Mite Extract   . Other     Grass, Mold, Mildew  . Shellfish Allergy   . Wheat Bran Other (See Comments)    All Wheat Products  . Eggs Or Egg-Derived Products Other (See Comments)    Sensitivity    Family History  Problem Relation Age of Onset  . Diabetes Mother   . Hypertension Father   . Heart disease Father   . Diabetes Brother   . Diabetes Maternal Grandmother   . Cancer Paternal Grandfather     Brain Tumor    History   Social History  . Marital Status: Legally Separated    Spouse Name: N/A  . Number of Children: N/A  . Years of Education: N/A   Occupational History  . Not on  file.   Social History Main Topics  . Smoking status: Never Smoker   . Smokeless tobacco: Never Used  . Alcohol Use: No  . Drug Use: No  . Sexual Activity: No   Other Topics Concern  . Not on file   Social History Narrative    Review of Systems  Constitutional: Negative for fever and weight loss.  HENT: Negative for ear discharge, ear pain, hearing loss and tinnitus.   Eyes: Negative for blurred vision, double vision, photophobia and pain.  Respiratory: Negative for cough and shortness of breath.   Cardiovascular: Negative for chest pain and palpitations.  Gastrointestinal: Negative for heartburn, nausea, vomiting, abdominal pain, diarrhea, constipation, blood in stool and melena.  Genitourinary: Negative for dysuria, urgency, frequency, hematuria and flank pain.  Musculoskeletal: Negative for falls.  Neurological: Negative for dizziness, loss of consciousness and headaches.  Endo/Heme/Allergies: Negative for environmental allergies.  Psychiatric/Behavioral: Negative for depression, suicidal ideas, hallucinations and substance abuse. The patient is not nervous/anxious and does not have insomnia.     BP 123/71 mmHg  Pulse 62  Temp(Src) 98 F (36.7 C) (Oral)  Resp 18  Ht 6' (1.829 m)  Wt 214 lb (97.07 kg)  BMI 29.02 kg/m2  SpO2 99%  Physical Exam  Constitutional: He is oriented to person, place, and time and  well-developed, well-nourished, and in no distress.  HENT:  Head: Normocephalic and atraumatic.  Right Ear: External ear normal.  Left Ear: External ear normal.  Nose: Nose normal.  Mouth/Throat: Oropharynx is clear and moist. No oropharyngeal exudate.  Eyes: Conjunctivae and EOM are normal. Pupils are equal, round, and reactive to light.  Neck: Neck supple. No thyromegaly present.  Cardiovascular: Normal rate, regular rhythm, normal heart sounds and intact distal pulses.   Pulmonary/Chest: Effort normal and breath sounds normal. No respiratory distress. He has no  wheezes. He has no rales. He exhibits no tenderness.  Abdominal: Soft. Bowel sounds are normal. He exhibits no distension and no mass. There is no tenderness. There is no rebound and no guarding.  Genitourinary: Testes/scrotum normal and penis normal. No discharge found.  Lymphadenopathy:    He has no cervical adenopathy.  Neurological: He is alert and oriented to person, place, and time.  Skin: Skin is warm and dry. No rash noted.  Psychiatric: Affect normal.  Vitals reviewed.  Recent Results (from the past 2160 hour(s))  CBC w/Diff     Status: None   Collection Time: 10/08/14  2:15 PM  Result Value Ref Range   WBC 5.3 4.0 - 10.5 K/uL   RBC 4.85 4.22 - 5.81 Mil/uL   Hemoglobin 15.9 13.0 - 17.0 g/dL   HCT 46.4 39.0 - 52.0 %   MCV 95.7 78.0 - 100.0 fl   MCHC 34.3 30.0 - 36.0 g/dL   RDW 12.7 11.5 - 15.5 %   Platelets 166.0 150.0 - 400.0 K/uL   Neutrophils Relative % 71.6 43.0 - 77.0 %   Lymphocytes Relative 21.3 12.0 - 46.0 %   Monocytes Relative 6.4 3.0 - 12.0 %   Eosinophils Relative 0.0 0.0 - 5.0 %   Basophils Relative 0.7 0.0 - 3.0 %   Neutro Abs 3.8 1.4 - 7.7 K/uL   Lymphs Abs 1.1 0.7 - 4.0 K/uL   Monocytes Absolute 0.3 0.1 - 1.0 K/uL   Eosinophils Absolute 0.0 0.0 - 0.7 K/uL   Basophils Absolute 0.0 0.0 - 0.1 K/uL  Lipase     Status: Abnormal   Collection Time: 10/08/14  2:15 PM  Result Value Ref Range   Lipase 67.0 (H) 11.0 - 59.0 U/L  Comp Met (CMET)     Status: Abnormal   Collection Time: 10/08/14  2:15 PM  Result Value Ref Range   Sodium 139 135 - 145 mEq/L   Potassium 4.7 3.5 - 5.1 mEq/L   Chloride 105 96 - 112 mEq/L   CO2 27 19 - 32 mEq/L   Glucose, Bld 85 70 - 99 mg/dL   BUN 16 6 - 23 mg/dL   Creatinine, Ser 0.95 0.40 - 1.50 mg/dL   Total Bilirubin 1.5 (H) 0.2 - 1.2 mg/dL   Alkaline Phosphatase 55 39 - 117 U/L   AST 24 0 - 37 U/L   ALT 19 0 - 53 U/L   Total Protein 7.2 6.0 - 8.3 g/dL   Albumin 4.4 3.5 - 5.2 g/dL   Calcium 9.3 8.4 - 10.5 mg/dL   GFR  89.53 >60.00 mL/min  Lipase     Status: None   Collection Time: 10/18/14  8:41 AM  Result Value Ref Range   Lipase 46.0 11.0 - 59.0 U/L  HIV antibody     Status: None   Collection Time: 10/22/14  1:49 PM  Result Value Ref Range   HIV 1&2 Ab, 4th Generation NONREACTIVE NONREACTIVE    Comment:  A NONREACTIVE HIV Ag/Ab result does not exclude HIV infection since the time frame for seroconversion is variable. If acute HIV infection is suspected, a HIV-1 RNA Qualitative TMA test is recommended.   HIV-1/2 Antibody Diff         Not indicated. HIV-1 RNA, Qual TMA           Not indicated.   PLEASE NOTE: This information has been disclosed to you from records whose confidentiality may be protected by state law. If your state requires such protection, then the state law prohibits you from making any further disclosure of the information without the specific written consent of the person to whom it pertains, or as otherwise permitted by law. A general authorization for the release of medical or other information is NOT sufficient for this purpose.   The performance of this assay has not been clinically validated in patients less than 47 years old.    Assessment/Plan: Gastroesophageal reflux disease without esophagitis Well-controlled on current regimen.  Will continue the same.  Follow-up in 6 months.   Visit for preventive health examination I have reviewed the patient's medical history in detail and updated the computerized patient record.  Health Maintenance up-to-date.  Will be due for colonoscopy and PSA next year.  Depression screen performed with score of 0.  Preventive care discussed.  Handout given.  Will obtain fasting labs today.

## 2014-11-14 ENCOUNTER — Ambulatory Visit: Payer: BC Managed Care – PPO | Admitting: Physician Assistant

## 2015-01-11 ENCOUNTER — Encounter: Payer: Self-pay | Admitting: Physician Assistant

## 2015-01-11 ENCOUNTER — Ambulatory Visit (INDEPENDENT_AMBULATORY_CARE_PROVIDER_SITE_OTHER): Payer: 59 | Admitting: Physician Assistant

## 2015-01-11 VITALS — BP 136/90 | HR 57 | Temp 98.2°F | Ht 72.0 in | Wt 219.4 lb

## 2015-01-11 DIAGNOSIS — M25552 Pain in left hip: Secondary | ICD-10-CM | POA: Diagnosis not present

## 2015-01-11 DIAGNOSIS — G609 Hereditary and idiopathic neuropathy, unspecified: Secondary | ICD-10-CM

## 2015-01-11 MED ORDER — GABAPENTIN 300 MG PO CAPS: 300.0000 mg | ORAL_CAPSULE | Freq: Every day | ORAL | Status: DC

## 2015-01-11 MED ORDER — BECLOMETHASONE DIPROPIONATE 80 MCG/ACT IN AERS: 2.0000 | INHALATION_SPRAY | Freq: Two times a day (BID) | RESPIRATORY_TRACT | Status: DC

## 2015-01-11 MED ORDER — CELECOXIB 100 MG PO CAPS: 100.0000 mg | ORAL_CAPSULE | Freq: Two times a day (BID) | ORAL | Status: DC

## 2015-01-11 NOTE — Progress Notes (Signed)
Pre visit review using our clinic review tool, if applicable. No additional management support is needed unless otherwise documented below in the visit note. 

## 2015-01-11 NOTE — Progress Notes (Signed)
Patient presents to clinic today c/o intermittent left hip pain over the past month.  Endorses difficulty walking when pain is present.  Pain first occurred while he was walking down the road.  Denies trauma or injury. Pain does not radiate. Denies back pain or injury.  Denies numbness, tingling or weakness. Pain is helped by rest.  Patient also still having neuropathy symptoms in legs and feet. Previously diagnosed with idiopathic neuropathy and workup unremarkable. Is now agreeable to attempt trial of medication.  Past Medical History  Diagnosis Date  . Asthma     Current Outpatient Prescriptions on File Prior to Visit  Medication Sig Dispense Refill  . albuterol (VENTOLIN HFA) 108 (90 BASE) MCG/ACT inhaler Inhale 1-2 puffs into the lungs every 4 (four) hours as needed for wheezing or shortness of breath (MAXIMUM OF 12 PUFFS PER DAY). 18 g 2  . fexofenadine (ALLEGRA) 180 MG tablet Take 180 mg by mouth daily as needed for allergies or rhinitis.    . fluticasone (FLONASE) 50 MCG/ACT nasal spray USE 2 SPRAYS NASALLY DAILY AS NEEDED 16 g 2  . omeprazole (PRILOSEC) 20 MG capsule Take 1 capsule (20 mg total) by mouth daily. 30 capsule 3  . Probiotic Product (PROBIOTIC DAILY) CAPS Take by mouth daily.     No current facility-administered medications on file prior to visit.    Allergies  Allergen Reactions  . Dust Mite Extract   . Other     Grass, Mold, Mildew  . Shellfish Allergy   . Wheat Bran Other (See Comments)    All Wheat Products  . Eggs Or Egg-Derived Products Other (See Comments)    Sensitivity    Family History  Problem Relation Age of Onset  . Diabetes Mother   . Hypertension Father   . Heart disease Father   . Diabetes Brother   . Diabetes Maternal Grandmother   . Cancer Paternal Grandfather     Brain Tumor    History   Social History  . Marital Status: Legally Separated    Spouse Name: N/A  . Number of Children: N/A  . Years of Education: N/A   Social  History Main Topics  . Smoking status: Never Smoker   . Smokeless tobacco: Never Used  . Alcohol Use: No  . Drug Use: No  . Sexual Activity: No   Other Topics Concern  . None   Social History Narrative   Review of Systems - See HPI.  All other ROS are negative.  BP 136/90 mmHg  Pulse 57  Temp(Src) 98.2 F (36.8 C) (Oral)  Ht 6' (1.829 m)  Wt 219 lb 6.4 oz (99.519 kg)  BMI 29.75 kg/m2  SpO2 97%  Physical Exam  Constitutional: He is oriented to person, place, and time and well-developed, well-nourished, and in no distress.  HENT:  Head: Normocephalic and atraumatic.  Eyes: Conjunctivae are normal. Pupils are equal, round, and reactive to light.  Neck: Neck supple.  Cardiovascular: Normal rate, regular rhythm, normal heart sounds and intact distal pulses.   Pulmonary/Chest: Effort normal and breath sounds normal. No respiratory distress. He has no wheezes. He has no rales. He exhibits no tenderness.  Neurological: He is alert and oriented to person, place, and time.  Skin: Skin is warm and dry. No rash noted.  Psychiatric: Affect normal.  Vitals reviewed.   Recent Results (from the past 2160 hour(s))  Lipase     Status: None   Collection Time: 10/18/14  8:41 AM  Result  Value Ref Range   Lipase 46.0 11.0 - 59.0 U/L  HIV antibody     Status: None   Collection Time: 10/22/14  1:49 PM  Result Value Ref Range   HIV 1&2 Ab, 4th Generation NONREACTIVE NONREACTIVE    Comment:   A NONREACTIVE HIV Ag/Ab result does not exclude HIV infection since the time frame for seroconversion is variable. If acute HIV infection is suspected, a HIV-1 RNA Qualitative TMA test is recommended.   HIV-1/2 Antibody Diff         Not indicated. HIV-1 RNA, Qual TMA           Not indicated.   PLEASE NOTE: This information has been disclosed to you from records whose confidentiality may be protected by state law. If your state requires such protection, then the state law prohibits you from  making any further disclosure of the information without the specific written consent of the person to whom it pertains, or as otherwise permitted by law. A general authorization for the release of medical or other information is NOT sufficient for this purpose.   The performance of this assay has not been clinically validated in patients less than 38 years old.   CBC     Status: None   Collection Time: 11/07/14  8:05 AM  Result Value Ref Range   WBC 5.0 4.0 - 10.5 K/uL   RBC 4.75 4.22 - 5.81 Mil/uL   Platelets 171.0 150.0 - 400.0 K/uL   Hemoglobin 15.7 13.0 - 17.0 g/dL   HCT 44.9 39.0 - 52.0 %   MCV 94.4 78.0 - 100.0 fl   MCHC 34.9 30.0 - 36.0 g/dL   RDW 12.7 11.5 - 15.5 %  Comp Met (CMET)     Status: Abnormal   Collection Time: 11/07/14  8:05 AM  Result Value Ref Range   Sodium 138 135 - 145 mEq/L   Potassium 3.9 3.5 - 5.1 mEq/L   Chloride 106 96 - 112 mEq/L   CO2 27 19 - 32 mEq/L   Glucose, Bld 99 70 - 99 mg/dL   BUN 15 6 - 23 mg/dL   Creatinine, Ser 0.97 0.40 - 1.50 mg/dL   Total Bilirubin 1.5 (H) 0.2 - 1.2 mg/dL   Alkaline Phosphatase 50 39 - 117 U/L   AST 23 0 - 37 U/L   ALT 20 0 - 53 U/L   Total Protein 6.7 6.0 - 8.3 g/dL   Albumin 4.2 3.5 - 5.2 g/dL   Calcium 9.0 8.4 - 10.5 mg/dL   GFR 87.38 >60.00 mL/min  Hemoglobin A1c     Status: None   Collection Time: 11/07/14  8:05 AM  Result Value Ref Range   Hgb A1c MFr Bld 5.5 4.6 - 6.5 %    Comment: Glycemic Control Guidelines for People with Diabetes:Non Diabetic:  <6%Goal of Therapy: <7%Additional Action Suggested:  >8%   Urinalysis, Routine w reflex microscopic     Status: None   Collection Time: 11/07/14  8:05 AM  Result Value Ref Range   Color, Urine YELLOW Yellow;Lt. Yellow   APPearance CLEAR Clear   Specific Gravity, Urine 1.025 1.000-1.030   pH 6.0 5.0 - 8.0   Total Protein, Urine NEGATIVE Negative   Urine Glucose NEGATIVE Negative   Ketones, ur NEGATIVE Negative   Bilirubin Urine NEGATIVE Negative   Hgb  urine dipstick NEGATIVE Negative   Urobilinogen, UA 0.2 0.0 - 1.0   Leukocytes, UA NEGATIVE Negative   Nitrite NEGATIVE Negative  WBC, UA 0-2/hpf 0-2/hpf   RBC / HPF 0-2/hpf 0-2/hpf  Lipid Profile     Status: None   Collection Time: 11/07/14  8:05 AM  Result Value Ref Range   Cholesterol 154 0 - 200 mg/dL    Comment: ATP III Classification       Desirable:  < 200 mg/dL               Borderline High:  200 - 239 mg/dL          High:  > = 240 mg/dL   Triglycerides 83.0 0.0 - 149.0 mg/dL    Comment: Normal:  <150 mg/dLBorderline High:  150 - 199 mg/dL   HDL 43.00 >39.00 mg/dL   VLDL 16.6 0.0 - 40.0 mg/dL   LDL Cholesterol 94 0 - 99 mg/dL   Total CHOL/HDL Ratio 4     Comment:                Men          Women1/2 Average Risk     3.4          3.3Average Risk          5.0          4.42X Average Risk          9.6          7.13X Average Risk          15.0          11.0                       NonHDL 111.00     Comment: NOTE:  Non-HDL goal should be 30 mg/dL higher than patient's LDL goal (i.e. LDL goal of < 70 mg/dL, would have non-HDL goal of < 100 mg/dL)  TSH     Status: None   Collection Time: 11/07/14  8:05 AM  Result Value Ref Range   TSH 2.73 0.35 - 4.50 uIU/mL    Assessment/Plan: Left hip pain tendinopathy suspected.  Rx Celebrex twice daily with food x 1.5 weeks.  Limit exercise.  RICE discussed.  Follow-up 2 weeks.    Idiopathic neuropathy Will begin gabapentin 300 mg QHS.  Follow-up  In 2 weeks.

## 2015-01-11 NOTE — Assessment & Plan Note (Signed)
Will begin gabapentin 300 mg QHS.  Follow-up  In 2 weeks.

## 2015-01-11 NOTE — Assessment & Plan Note (Signed)
tendinopathy suspected.  Rx Celebrex twice daily with food x 1.5 weeks.  Limit exercise.  RICE discussed.  Follow-up 2 weeks.

## 2015-01-11 NOTE — Patient Instructions (Signed)
Please take Gabapentin at bedtime  To help with nerve pain.  Stay active. For the hip -- lay off of the jogging and resistance exercises.  Start the Celebrex twice daily over the next week.  Elevate legs while resting at home.  Follow-up 2 weeks.

## 2015-01-22 ENCOUNTER — Ambulatory Visit (INDEPENDENT_AMBULATORY_CARE_PROVIDER_SITE_OTHER): Payer: 59 | Admitting: Physician Assistant

## 2015-01-22 ENCOUNTER — Encounter: Payer: Self-pay | Admitting: Physician Assistant

## 2015-01-22 VITALS — BP 125/72 | HR 69 | Temp 98.7°F | Resp 16 | Ht 72.0 in | Wt 219.5 lb

## 2015-01-22 DIAGNOSIS — F411 Generalized anxiety disorder: Secondary | ICD-10-CM

## 2015-01-22 DIAGNOSIS — G609 Hereditary and idiopathic neuropathy, unspecified: Secondary | ICD-10-CM

## 2015-01-22 MED ORDER — ESCITALOPRAM OXALATE 10 MG PO TABS: 10.0000 mg | ORAL_TABLET | Freq: Every day | ORAL | Status: DC

## 2015-01-22 NOTE — Assessment & Plan Note (Signed)
Improving.  Will continue current regimen.  Will follow-up 6 months.

## 2015-01-22 NOTE — Patient Instructions (Signed)
Please continue the Gabapentin as directed. Start the Lexapro taking 1/2 tablet daily for 5 days. Then increase to 1 tablet daily.

## 2015-01-22 NOTE — Assessment & Plan Note (Signed)
Encouraged patient to continue counseling services.  Will begin Lexapro 5 mg daily x 4-5 days before increasing to 10 mg daily.  Supportive measures reviewed. Potential side effects reviewed with patient.  Follow-up in 4-6 weeks.

## 2015-01-22 NOTE — Progress Notes (Signed)
Pre visit review using our clinic review tool, if applicable. No additional management support is needed unless otherwise documented below in the visit note/SLS  

## 2015-01-22 NOTE — Progress Notes (Signed)
Patient presents to clinic today for 2-week follow-up of neuropathy after starting Gabapentin.  Is taking as directed with improvement in his symptoms.  Denies somnolence with medication.  Endorses good mood. Is still dealing with generalized anxiety not helped by counseling.  Denies panic attack.  Denies SI/HI.  Past Medical History  Diagnosis Date  . Asthma     Current Outpatient Prescriptions on File Prior to Visit  Medication Sig Dispense Refill  . albuterol (VENTOLIN HFA) 108 (90 BASE) MCG/ACT inhaler Inhale 1-2 puffs into the lungs every 4 (four) hours as needed for wheezing or shortness of breath (MAXIMUM OF 12 PUFFS PER DAY). 18 g 2  . beclomethasone (QVAR) 80 MCG/ACT inhaler Inhale 2 puffs into the lungs 2 (two) times daily. 1 Inhaler 12  . celecoxib (CELEBREX) 100 MG capsule Take 1 capsule (100 mg total) by mouth 2 (two) times daily. 30 capsule 1  . fexofenadine (ALLEGRA) 180 MG tablet Take 180 mg by mouth daily as needed for allergies or rhinitis.    . fluticasone (FLONASE) 50 MCG/ACT nasal spray USE 2 SPRAYS NASALLY DAILY AS NEEDED 16 g 2  . gabapentin (NEURONTIN) 300 MG capsule Take 1 capsule (300 mg total) by mouth at bedtime. 30 capsule 3  . omeprazole (PRILOSEC) 20 MG capsule Take 1 capsule (20 mg total) by mouth daily. 30 capsule 3  . Probiotic Product (PROBIOTIC DAILY) CAPS Take by mouth daily.     No current facility-administered medications on file prior to visit.    Allergies  Allergen Reactions  . Dust Mite Extract   . Other     Grass, Mold, Mildew  . Shellfish Allergy   . Wheat Bran Other (See Comments)    All Wheat Products  . Eggs Or Egg-Derived Products Other (See Comments)    Sensitivity    Family History  Problem Relation Age of Onset  . Diabetes Mother   . Hypertension Father   . Heart disease Father   . Diabetes Brother   . Diabetes Maternal Grandmother   . Cancer Paternal Grandfather     Brain Tumor    History   Social History  .  Marital Status: Legally Separated    Spouse Name: N/A  . Number of Children: N/A  . Years of Education: N/A   Social History Main Topics  . Smoking status: Never Smoker   . Smokeless tobacco: Never Used  . Alcohol Use: No  . Drug Use: No  . Sexual Activity: No   Other Topics Concern  . None   Social History Narrative    Review of Systems - See HPI.  All other ROS are negative.  BP 125/72 mmHg  Pulse 69  Temp(Src) 98.7 F (37.1 C) (Oral)  Resp 16  Ht 6' (1.829 m)  Wt 219 lb 8 oz (99.565 kg)  BMI 29.76 kg/m2  SpO2 98%  Physical Exam  Constitutional: He is oriented to person, place, and time and well-developed, well-nourished, and in no distress.  HENT:  Head: Normocephalic and atraumatic.  Eyes: Conjunctivae are normal.  Neck: Neck supple.  Cardiovascular: Normal rate and regular rhythm.   Pulmonary/Chest: Effort normal.  Neurological: He is alert and oriented to person, place, and time.  Skin: Skin is warm and dry. No rash noted.  Psychiatric: Affect normal.  Vitals reviewed.  Recent Results (from the past 2160 hour(s))  CBC     Status: None   Collection Time: 11/07/14  8:05 AM  Result Value Ref Range  WBC 5.0 4.0 - 10.5 K/uL   RBC 4.75 4.22 - 5.81 Mil/uL   Platelets 171.0 150.0 - 400.0 K/uL   Hemoglobin 15.7 13.0 - 17.0 g/dL   HCT 44.9 39.0 - 52.0 %   MCV 94.4 78.0 - 100.0 fl   MCHC 34.9 30.0 - 36.0 g/dL   RDW 12.7 11.5 - 15.5 %  Comp Met (CMET)     Status: Abnormal   Collection Time: 11/07/14  8:05 AM  Result Value Ref Range   Sodium 138 135 - 145 mEq/L   Potassium 3.9 3.5 - 5.1 mEq/L   Chloride 106 96 - 112 mEq/L   CO2 27 19 - 32 mEq/L   Glucose, Bld 99 70 - 99 mg/dL   BUN 15 6 - 23 mg/dL   Creatinine, Ser 0.97 0.40 - 1.50 mg/dL   Total Bilirubin 1.5 (H) 0.2 - 1.2 mg/dL   Alkaline Phosphatase 50 39 - 117 U/L   AST 23 0 - 37 U/L   ALT 20 0 - 53 U/L   Total Protein 6.7 6.0 - 8.3 g/dL   Albumin 4.2 3.5 - 5.2 g/dL   Calcium 9.0 8.4 - 10.5 mg/dL    GFR 87.38 >60.00 mL/min  Hemoglobin A1c     Status: None   Collection Time: 11/07/14  8:05 AM  Result Value Ref Range   Hgb A1c MFr Bld 5.5 4.6 - 6.5 %    Comment: Glycemic Control Guidelines for People with Diabetes:Non Diabetic:  <6%Goal of Therapy: <7%Additional Action Suggested:  >8%   Urinalysis, Routine w reflex microscopic     Status: None   Collection Time: 11/07/14  8:05 AM  Result Value Ref Range   Color, Urine YELLOW Yellow;Lt. Yellow   APPearance CLEAR Clear   Specific Gravity, Urine 1.025 1.000-1.030   pH 6.0 5.0 - 8.0   Total Protein, Urine NEGATIVE Negative   Urine Glucose NEGATIVE Negative   Ketones, ur NEGATIVE Negative   Bilirubin Urine NEGATIVE Negative   Hgb urine dipstick NEGATIVE Negative   Urobilinogen, UA 0.2 0.0 - 1.0   Leukocytes, UA NEGATIVE Negative   Nitrite NEGATIVE Negative   WBC, UA 0-2/hpf 0-2/hpf   RBC / HPF 0-2/hpf 0-2/hpf  Lipid Profile     Status: None   Collection Time: 11/07/14  8:05 AM  Result Value Ref Range   Cholesterol 154 0 - 200 mg/dL    Comment: ATP III Classification       Desirable:  < 200 mg/dL               Borderline High:  200 - 239 mg/dL          High:  > = 240 mg/dL   Triglycerides 83.0 0.0 - 149.0 mg/dL    Comment: Normal:  <150 mg/dLBorderline High:  150 - 199 mg/dL   HDL 43.00 >39.00 mg/dL   VLDL 16.6 0.0 - 40.0 mg/dL   LDL Cholesterol 94 0 - 99 mg/dL   Total CHOL/HDL Ratio 4     Comment:                Men          Women1/2 Average Risk     3.4          3.3Average Risk          5.0          4.42X Average Risk          9.6  7.13X Average Risk          15.0          11.0                       NonHDL 111.00     Comment: NOTE:  Non-HDL goal should be 30 mg/dL higher than patient's LDL goal (i.e. LDL goal of < 70 mg/dL, would have non-HDL goal of < 100 mg/dL)  TSH     Status: None   Collection Time: 11/07/14  8:05 AM  Result Value Ref Range   TSH 2.73 0.35 - 4.50 uIU/mL   Assessment/Plan: Idiopathic  neuropathy Improving.  Will continue current regimen.  Will follow-up 6 months.   Anxiety state Encouraged patient to continue counseling services.  Will begin Lexapro 5 mg daily x 4-5 days before increasing to 10 mg daily.  Supportive measures reviewed. Potential side effects reviewed with patient.  Follow-up in 4-6 weeks.

## 2015-01-25 ENCOUNTER — Ambulatory Visit: Payer: 59 | Admitting: Physician Assistant

## 2015-01-28 ENCOUNTER — Other Ambulatory Visit: Payer: Self-pay | Admitting: Physician Assistant

## 2015-02-06 ENCOUNTER — Other Ambulatory Visit: Payer: Self-pay | Admitting: Physician Assistant

## 2015-02-06 DIAGNOSIS — F411 Generalized anxiety disorder: Secondary | ICD-10-CM

## 2015-02-06 MED ORDER — GABAPENTIN 300 MG PO CAPS: 300.0000 mg | ORAL_CAPSULE | Freq: Every day | ORAL | Status: DC

## 2015-02-06 MED ORDER — BECLOMETHASONE DIPROPIONATE 80 MCG/ACT IN AERS: 2.0000 | INHALATION_SPRAY | Freq: Two times a day (BID) | RESPIRATORY_TRACT | Status: DC

## 2015-02-06 MED ORDER — OMEPRAZOLE 20 MG PO CPDR: 20.0000 mg | DELAYED_RELEASE_CAPSULE | Freq: Every day | ORAL | Status: DC

## 2015-02-06 MED ORDER — ESCITALOPRAM OXALATE 10 MG PO TABS: 10.0000 mg | ORAL_TABLET | Freq: Every day | ORAL | Status: DC

## 2015-02-20 ENCOUNTER — Ambulatory Visit (INDEPENDENT_AMBULATORY_CARE_PROVIDER_SITE_OTHER): Payer: 59 | Admitting: Physician Assistant

## 2015-02-20 ENCOUNTER — Encounter: Payer: Self-pay | Admitting: Physician Assistant

## 2015-02-20 VITALS — BP 133/79 | HR 60 | Temp 98.0°F | Ht 72.0 in | Wt 216.0 lb

## 2015-02-20 DIAGNOSIS — F411 Generalized anxiety disorder: Secondary | ICD-10-CM

## 2015-02-20 NOTE — Progress Notes (Signed)
Pre visit review using our clinic review tool, if applicable. No additional management support is needed unless otherwise documented below in the visit note. 

## 2015-02-20 NOTE — Progress Notes (Signed)
Patient presents to clinic today for 1 month follow-up anxiety after starting Lexapro 10 mg daily.  Endorses feeling much calmer with medication. Denies depressed mood or anxiety but is still having some difficulty with sleep on certain nights but feels is mostly on nights where he had a stressful workday.  Past Medical History  Diagnosis Date  . Asthma     Current Outpatient Prescriptions on File Prior to Visit  Medication Sig Dispense Refill  . albuterol (VENTOLIN HFA) 108 (90 BASE) MCG/ACT inhaler Inhale 1-2 puffs into the lungs every 4 (four) hours as needed for wheezing or shortness of breath (MAXIMUM OF 12 PUFFS PER DAY). 18 g 2  . beclomethasone (QVAR) 80 MCG/ACT inhaler Inhale 2 puffs into the lungs 2 (two) times daily. 1 Inhaler 12  . escitalopram (LEXAPRO) 10 MG tablet Take 1 tablet (10 mg total) by mouth daily. 90 tablet 1  . fexofenadine (ALLEGRA) 180 MG tablet Take 180 mg by mouth daily as needed for allergies or rhinitis.    . fluticasone (FLONASE) 50 MCG/ACT nasal spray USE 2 SPRAYS NASALLY DAILY AS NEEDED 16 g 2  . gabapentin (NEURONTIN) 300 MG capsule Take 1 capsule (300 mg total) by mouth at bedtime. 90 capsule 1  . omeprazole (PRILOSEC) 20 MG capsule Take 1 capsule (20 mg total) by mouth daily. 90 capsule 1  . Probiotic Product (PROBIOTIC DAILY) CAPS Take by mouth daily.    . celecoxib (CELEBREX) 100 MG capsule Take 1 capsule (100 mg total) by mouth 2 (two) times daily. (Patient not taking: Reported on 02/20/2015) 30 capsule 1   No current facility-administered medications on file prior to visit.    Allergies  Allergen Reactions  . Dust Mite Extract   . Other     Grass, Mold, Mildew  . Shellfish Allergy   . Wheat Bran Other (See Comments)    All Wheat Products  . Eggs Or Egg-Derived Products Other (See Comments)    Sensitivity    Family History  Problem Relation Age of Onset  . Diabetes Mother   . Hypertension Father   . Heart disease Father   . Diabetes  Brother   . Diabetes Maternal Grandmother   . Cancer Paternal Grandfather     Brain Tumor    History   Social History  . Marital Status: Legally Separated    Spouse Name: N/A  . Number of Children: N/A  . Years of Education: N/A   Social History Main Topics  . Smoking status: Never Smoker   . Smokeless tobacco: Never Used  . Alcohol Use: No  . Drug Use: No  . Sexual Activity: No   Other Topics Concern  . None   Social History Narrative   Review of Systems - See HPI.  All other ROS are negative.  BP 133/79 mmHg  Pulse 60  Temp(Src) 98 F (36.7 C) (Oral)  Ht 6' (1.829 m)  Wt 216 lb (97.977 kg)  BMI 29.29 kg/m2  SpO2 98%  Physical Exam  Constitutional: He is oriented to person, place, and time and well-developed, well-nourished, and in no distress.  HENT:  Head: Normocephalic and atraumatic.  Eyes: Conjunctivae are normal.  Cardiovascular: Normal rate, regular rhythm, normal heart sounds and intact distal pulses.   Pulmonary/Chest: Effort normal and breath sounds normal. No respiratory distress. He has no wheezes. He has no rales. He exhibits no tenderness.  Neurological: He is alert and oriented to person, place, and time.  Skin: Skin is warm  and dry. No rash noted.  Psychiatric: Affect normal.  Vitals reviewed.   No results found for this or any previous visit (from the past 2160 hour(s)).  Assessment/Plan: Anxiety state Improving. Will continue Lexapro 10 mg daily. Follow-up 6 months.

## 2015-02-20 NOTE — Patient Instructions (Signed)
Please continue your Lexapro daily as directed. I am glad you are feeling better.  We will continue the current regimen for now.  Follow-up 6 months.

## 2015-02-20 NOTE — Assessment & Plan Note (Signed)
Improving. Will continue Lexapro 10 mg daily. Follow-up 6 months.

## 2015-04-10 ENCOUNTER — Other Ambulatory Visit: Payer: Self-pay | Admitting: Physician Assistant

## 2015-04-10 NOTE — Telephone Encounter (Signed)
Rx request to pharmacy/[facility]  

## 2015-04-11 ENCOUNTER — Other Ambulatory Visit: Payer: Self-pay | Admitting: Physician Assistant

## 2015-04-11 NOTE — Telephone Encounter (Signed)
Pt called in. He says that the pharmacy is stating that they didn't receive the script for his NASONEX .Marland Kitchen     He would like to have it resent please.    Pt's CB#: [phone]

## 2015-07-10 ENCOUNTER — Ambulatory Visit (INDEPENDENT_AMBULATORY_CARE_PROVIDER_SITE_OTHER): Payer: 59 | Admitting: Physician Assistant

## 2015-07-10 ENCOUNTER — Encounter: Payer: Self-pay | Admitting: Physician Assistant

## 2015-07-10 VITALS — BP 135/84 | HR 56 | Temp 98.0°F | Resp 16 | Ht 72.0 in | Wt 230.1 lb

## 2015-07-10 DIAGNOSIS — K219 Gastro-esophageal reflux disease without esophagitis: Secondary | ICD-10-CM

## 2015-07-10 DIAGNOSIS — F418 Other specified anxiety disorders: Secondary | ICD-10-CM

## 2015-07-10 DIAGNOSIS — F419 Anxiety disorder, unspecified: Secondary | ICD-10-CM

## 2015-07-10 DIAGNOSIS — F329 Major depressive disorder, single episode, unspecified: Secondary | ICD-10-CM | POA: Insufficient documentation

## 2015-07-10 MED ORDER — SERTRALINE HCL 50 MG PO TABS: 50.0000 mg | ORAL_TABLET | Freq: Every day | ORAL | Status: DC

## 2015-07-10 MED ORDER — DEXLANSOPRAZOLE 30 MG PO CPDR: 30.0000 mg | DELAYED_RELEASE_CAPSULE | Freq: Every day | ORAL | Status: DC

## 2015-07-10 MED ORDER — GABAPENTIN 600 MG PO TABS: 600.0000 mg | ORAL_TABLET | Freq: Three times a day (TID) | ORAL | Status: DC

## 2015-07-10 MED ORDER — ALBUTEROL SULFATE HFA 108 (90 BASE) MCG/ACT IN AERS: 1.0000 | INHALATION_SPRAY | RESPIRATORY_TRACT | Status: DC | PRN

## 2015-07-10 NOTE — Progress Notes (Signed)
Pre visit review using our clinic review tool, if applicable. No additional management support is needed unless otherwise documented below in the visit note/SLS  

## 2015-07-10 NOTE — Patient Instructions (Signed)
Please stop the Omeprazole (Prilosec) and start the Dexilant daily. You will be contacted by Gastroenterology for further assessment of this issue. I am concerned you have a hiatal hernia. We will get it get it figured out!  Please start the Sertraline daily -- Take 1/2 tablet daily for 1 week. Then increase to 1 tablet daily. Start the new pill of Gabapentin (600 mg)

## 2015-07-12 NOTE — Progress Notes (Signed)
Patient presents to clinic today c/o worsening reflux and belching with epigastric pain during exercise only. Patient has been taking his omeprazole as directed without good relief of symptoms. Denies nausea or vomiting. Denies change to bowel habits. Denies melena or hematochezia.  Patient also with increased anxiety due to worsening stressors at work. Patient stopped his Lexapro over a month ago as he states it was sub-therapeutic. Is now dealing with depressed mood and anhedonia. Denies SI/HI.  Past Medical History  Diagnosis Date  . Asthma     Current Outpatient Prescriptions on File Prior to Visit  Medication Sig Dispense Refill  . ADVAIR DISKUS 100-50 MCG/DOSE AEPB INHALE 1 PUFF BY MOUTH TWICE DAILY. RINSE MOUTH AND SPIT AFTER USE 60 each 3  . fexofenadine (ALLEGRA) 180 MG tablet Take 180 mg by mouth daily as needed for allergies or rhinitis.    . mometasone (NASONEX) 50 MCG/ACT nasal spray USE 2 PUFFS IN EACH NOSTRIL DAILY 17 g 2  . Probiotic Product (PROBIOTIC DAILY) CAPS Take by mouth daily.     No current facility-administered medications on file prior to visit.    Allergies  Allergen Reactions  . Dust Mite Extract   . Other     Grass, Mold, Mildew  . Shellfish Allergy   . Wheat Bran Other (See Comments)    All Wheat Products  . Eggs Or Egg-Derived Products Other (See Comments)    Sensitivity    Family History  Problem Relation Age of Onset  . Diabetes Mother   . Hypertension Father   . Heart disease Father   . Diabetes Brother   . Diabetes Maternal Grandmother   . Cancer Paternal Grandfather     Brain Tumor    Social History   Social History  . Marital Status: Legally Separated    Spouse Name: N/A  . Number of Children: N/A  . Years of Education: N/A   Social History Main Topics  . Smoking status: Never Smoker   . Smokeless tobacco: Never Used  . Alcohol Use: No  . Drug Use: No  . Sexual Activity: No   Other Topics Concern  . None   Social  History Narrative   Review of Systems - See HPI.  All other ROS are negative.  BP 135/84 mmHg  Pulse 56  Temp(Src) 98 F (36.7 C) (Oral)  Resp 16  Ht 6' (1.829 m)  Wt 230 lb 2 oz (104.384 kg)  BMI 31.20 kg/m2  SpO2 98%  Physical Exam  Constitutional: He is oriented to person, place, and time and well-developed, well-nourished, and in no distress.  HENT:  Head: Normocephalic and atraumatic.  Cardiovascular: Normal rate, regular rhythm, normal heart sounds and intact distal pulses.   Pulmonary/Chest: Effort normal and breath sounds normal. No respiratory distress. He has no wheezes. He has no rales. He exhibits no tenderness.  Abdominal: Soft. Bowel sounds are normal. He exhibits no distension and no mass. There is no tenderness. There is no rebound and no guarding.  Neurological: He is alert and oriented to person, place, and time.  Skin: Skin is warm and dry.  Psychiatric: His mood appears anxious. He exhibits a depressed mood. He expresses no homicidal and no suicidal ideation. He expresses no suicidal plans and no homicidal plans.  Vitals reviewed.   No results found for this or any previous visit (from the past 2160 hour(s)).  Assessment/Plan: Anxiety and depression Discussed symptoms are mostly situational due to bad work arrangements. Will begin  trial of Sertraline taking 25 mg daily for 1 week, then increasing to 50 mg daily. Patient to continue counseling sessions. Follow-up in 1 month.  Gastroesophageal reflux disease without esophagitis Concern for hiatal hernia giving nature of symptoms. Rx Dexilant to take as directed. GERD diet reviewed. Low-intensity exercise for now. Referral to GI placed for assessment and EGD.

## 2015-07-12 NOTE — Assessment & Plan Note (Signed)
Discussed symptoms are mostly situational due to bad work arrangements. Will begin trial of Sertraline taking 25 mg daily for 1 week, then increasing to 50 mg daily. Patient to continue counseling sessions. Follow-up in 1 month.

## 2015-07-12 NOTE — Assessment & Plan Note (Signed)
Concern for hiatal hernia giving nature of symptoms. Rx Dexilant to take as directed. GERD diet reviewed. Low-intensity exercise for now. Referral to GI placed for assessment and EGD.

## 2015-07-13 ENCOUNTER — Other Ambulatory Visit: Payer: Self-pay | Admitting: Physician Assistant

## 2015-07-19 ENCOUNTER — Ambulatory Visit (INDEPENDENT_AMBULATORY_CARE_PROVIDER_SITE_OTHER): Payer: 59 | Admitting: Physician Assistant

## 2015-07-19 ENCOUNTER — Other Ambulatory Visit (INDEPENDENT_AMBULATORY_CARE_PROVIDER_SITE_OTHER): Payer: 59

## 2015-07-19 ENCOUNTER — Encounter: Payer: Self-pay | Admitting: Physician Assistant

## 2015-07-19 VITALS — BP 126/80 | HR 61 | Ht 73.0 in | Wt 226.8 lb

## 2015-07-19 DIAGNOSIS — K219 Gastro-esophageal reflux disease without esophagitis: Secondary | ICD-10-CM

## 2015-07-19 DIAGNOSIS — R1310 Dysphagia, unspecified: Secondary | ICD-10-CM

## 2015-07-19 LAB — IGA: IgA: 220 mg/dL (ref 68–378)

## 2015-07-19 NOTE — Progress Notes (Addendum)
Patient ID: Francisco Morrison, male   DOB: 05/22/1966, 49 y.o.   MRN: [id number]    HPI:  Francisco Morrison is a 49 y.o.   male referred by Waldon Merl, PA-C for evaluation of GERD and dysphagia. His past medical history significant For asthma and multiple allergies. He states his allergies are "off the chart". He is on a gluten-free diet for a self diagnosed gluten sensitivity. He states he has a sensitivity to wheat, potatoes, shellfish, yeast, eggs, and nuts. He reports that he is highly allergic to grass, dust, and molds. He has never had allergy shots. He also has a history of anxiety for which she is on gabapentin. He reports that he has a history of vitamin D deficiency and has had to be on vitamin D supplementation in the past. He denies a family history of celiac disease but questions if he may have celiac disease.  He reports that he has been troubled with reflux for about a year. Initially he was tried on Prilosec that helped at first and then seemed to lose its effect. He has no heartburn but states when he begins to work out he starts to belch a lot when he runs, he feels as something is moving in his chest. And he has to belch a lot. He also reports that he has been having difficulty swallowing meats and dry foods, especially chicken, for the past year. He has no dysphagia to liquids. He does have the sensation of a lump in his throat intermittently. He was evaluated at his primary care provider's office last weekend and changed to Dexilant He states since on the Dexilant he has been constipated. He has no bright red blood per rectum or melena. He denies a family history of colon cancer, colon polyps, or inflammatory bowel disease.   Past Medical History  Diagnosis Date  . Asthma     History reviewed. No pertinent past surgical history. Family History  Problem Relation Age of Onset  . Diabetes Mother   . Hypertension Father   . Heart disease Father   . Diabetes Brother   . Diabetes  Maternal Grandmother   . Cancer Paternal Grandfather     Brain Tumor   Social History  Substance Use Topics  . Smoking status: Never Smoker   . Smokeless tobacco: Never Used  . Alcohol Use: No   Current Outpatient Prescriptions  Medication Sig Dispense Refill  . ADVAIR DISKUS 100-50 MCG/DOSE AEPB INHALE 1 PUFF BY MOUTH TWICE DAILY. RINSE MOUTH AND SPIT AFTER USE 60 each 3  . albuterol (VENTOLIN HFA) 108 (90 BASE) MCG/ACT inhaler Inhale 1-2 puffs into the lungs every 4 (four) hours as needed for wheezing or shortness of breath (MAXIMUM OF 12 PUFFS PER DAY). 18 g 2  . Dexlansoprazole 30 MG capsule Take 1 capsule (30 mg total) by mouth daily. 30 capsule 3  . fexofenadine (ALLEGRA) 180 MG tablet Take 180 mg by mouth daily as needed for allergies or rhinitis.    Marland Kitchen gabapentin (NEURONTIN) 600 MG tablet Take 1 tablet (600 mg total) by mouth 3 (three) times daily. 30 tablet 5  . mometasone (NASONEX) 50 MCG/ACT nasal spray USE 2 PUFFS IN EACH NOSTRIL DAILY 17 g 2  . Probiotic Product (PROBIOTIC DAILY) CAPS Take by mouth daily.    . sertraline (ZOLOFT) 50 MG tablet Take 1 tablet (50 mg total) by mouth daily. 30 tablet 5   No current facility-administered medications for this visit.   Allergies  Allergen  Reactions  . Dust Mite Extract   . Other     Grass, Mold, Mildew  . Shellfish Allergy   . Wheat Bran Other (See Comments)    All Wheat Products  . Eggs Or Egg-Derived Products Other (See Comments)    Sensitivity     Review of Systems: Gen: Denies any fever, chills, sweats, anorexia, fatigue, weakness, malaise, weight loss, and sleep disorder CV: Denies chest pain, angina, palpitations, syncope, orthopnea, PND, peripheral edema, and claudication. Resp: Denies dyspnea at rest, dyspnea with exercise, cough, sputum, wheezing, coughing up blood, and pleurisy. GI: Denies vomiting blood, jaundice, and fecal incontinence.   Admits to dysphagia to solids. GU : Denies urinary burning, blood in  urine, urinary frequency, urinary hesitancy, nocturnal urination, and urinary incontinence. MS: Denies joint pain, limitation of movement, and swelling, stiffness, low back pain, extremity pain. Denies muscle weakness, cramps, atrophy.  Derm: Denies rash, itching, dry skin, hives, moles, warts, or unhealing ulcers.  Psych: Denies depression, anxiety, memory loss, suicidal ideation, hallucinations, paranoia, and confusion. Heme: Denies bruising, bleeding, and enlarged lymph nodes. Neuro:  Denies any headaches, dizziness, paresthesias. Endo:  Denies any problems with DM, thyroid, adrenal function    Physical Exam: BP 126/80 mmHg  Pulse 61  Ht 6\' 1"[id number]$  (1.854 m)  Wt 226 lb 12.8 oz (102.876 kg)  BMI 29.93 kg/m2 Constitutional: Pleasant,well-developed, Caucasian male in no acute distress. HEENT: Normocephalic and atraumatic. Conjunctivae are normal. No scleral icterus. Neck supple. No JVD Cardiovascular: Normal rate, regular rhythm.  Pulmonary/chest: Effort normal and breath sounds normal. No wheezing, rales or rhonchi. Abdominal: Soft, nondistended, nontender. Bowel sounds active throughout. There are no masses palpable. No hepatomegaly. Extremities: no edema Lymphadenopathy: No cervical adenopathy noted. Neurological: Alert and oriented to person place and time. Skin: Skin is warm and dry. No rashes noted. Psychiatric: Normal mood and affect. Behavior is normal.  ASSESSMENT AND PLAN: 49 year old male with a history of reflux presenting with complaints of dysphagia that has become progressively worse over the past several months. An antireflux regimen has been reviewed. He will continue his dexilant for 1-2 more weeks. If it does not seem to help with his reflux, he will call and be changed to Protonix. In the meantime an antireflux regimen has been reviewed. He will be scheduled for a barium swallow with tablet. He will then be scheduled for an EGD with possible dilation, as well as to assess  for reflux esophagitis, eosinophilic esophagitis, etc. The risks, benefits, and alternatives to endoscopy with possible biopsy and possible dilation were discussed with the patient and they consent to proceed. The procedure will be scheduled with Dr. Rhea BeltonPyrtle. Due to his questions of possible celiac disease, an IgA and TTG will be obtained, and small bowel biopsies will be obtained at the time of his EGD.  Dorota Heinrichs, Moise BoringLori P PA-C 07/19/2015, 12:41 PM  CC: Waldon MerlMartin, William C, PA-C   Addendum: Reviewed and agree with initial management. Beverley FiedlerJay M Pyrtle, MD

## 2015-07-19 NOTE — Patient Instructions (Signed)
You have been scheduled for a Barium Esophogram at [facility] (1st floor of the hospital) on 07-23-2015 at 11:30am. Please arrive 15 minutes prior to your appointment for registration. Make certain not to have anything to eat or drink 3 hours prior to your test. If you need to reschedule for any reason, please contact radiology at [phone] to do so. __________________________________________________________________ A barium swallow is an examination that concentrates on views of the esophagus. This tends to be a double contrast exam (barium and two liquids which, when combined, create a gas to distend the wall of the oesophagus) or single contrast (non-ionic iodine based). The study is usually tailored to your symptoms so a good history is essential. Attention is paid during the study to the form, structure and configuration of the esophagus, looking for functional disorders (such as aspiration, dysphagia, achalasia, motility and reflux) EXAMINATION You may be asked to change into a gown, depending on the type of swallow being performed. A radiologist and radiographer will perform the procedure. The radiologist will advise you of the type of contrast selected for your procedure and direct you during the exam. You will be asked to stand, sit or lie in several different positions and to hold a small amount of fluid in your mouth before being asked to swallow while the imaging is performed .In some instances you may be asked to swallow barium coated marshmallows to assess the motility of a solid food bolus. The exam can be recorded as a digital or video fluoroscopy procedure. POST PROCEDURE It will take 1-2 days for the barium to pass through your system. To facilitate this, it is important, unless otherwise directed, to increase your fluids for the next 24-48hrs and to resume your normal diet.  This test typically takes about 30 minutes to  perform. __________________________________________________________________________________  Continue Dexilant 30mg  daily. Please call back in one week if Dexilant is not working.  Your physician has requested that you go to the basement for the lab work before leaving today.  You have been given GERD and eosinophilic esophagitis information to read and review.         Food Choices for Gastroesophageal Reflux Disease, Adult When you have gastroesophageal reflux disease (GERD), the foods you eat and your eating habits are very important. Choosing the right foods can help ease the discomfort of GERD. WHAT GENERAL GUIDELINES DO I NEED TO FOLLOW?  Choose fruits, vegetables, whole grains, low-fat dairy products, and low-fat meat, fish, and poultry.  Limit fats such as oils, salad dressings, butter, nuts, and avocado.  Keep a food diary to identify foods that cause symptoms.  Avoid foods that cause reflux. These may be different for different people.  Eat frequent small meals instead of three large meals each day.  Eat your meals slowly, in a relaxed setting.  Limit fried foods.  Cook foods using methods other than frying.  Avoid drinking alcohol.  Avoid drinking large amounts of liquids with your meals.  Avoid bending over or lying down until 2-3 hours after eating. WHAT FOODS ARE NOT RECOMMENDED? The following are some foods and drinks that may worsen your symptoms: Vegetables Tomatoes. Tomato juice. Tomato and spaghetti sauce. Chili peppers. Onion and garlic. Horseradish. Fruits Oranges, grapefruit, and lemon (fruit and juice). Meats High-fat meats, fish, and poultry. This includes hot dogs, ribs, ham, sausage, salami, and bacon. Dairy Whole milk and chocolate milk. Sour cream. Cream. Butter. Ice cream. Cream cheese.  Beverages Coffee and tea, with or without caffeine.  Carbonated beverages or energy drinks. Condiments Hot sauce. Barbecue sauce.   Sweets/Desserts Chocolate and cocoa. Donuts. Peppermint and spearmint. Fats and Oils High-fat foods, including [place] fries and potato chips. Other Vinegar. Strong spices, such as black pepper, white pepper, red pepper, cayenne, curry powder, cloves, ginger, and chili powder. The items listed above may not be a complete list of foods and beverages to avoid. Contact your dietitian for more information.   This information is not intended to replace advice given to you by your health care provider. Make sure you discuss any questions you have with your health care provider.   Document Released: 08/03/2005 Document Revised: 08/24/2014 Document Reviewed: 06/07/2013 Elsevier Interactive Patient Education [employer]

## 2015-07-22 ENCOUNTER — Other Ambulatory Visit: Payer: Self-pay | Admitting: Physician Assistant

## 2015-07-22 LAB — TISSUE TRANSGLUTAMINASE, IGA: TISSUE TRANSGLUTAMINASE AB, IGA: 1 U/mL (ref <4)

## 2015-07-23 ENCOUNTER — Ambulatory Visit (HOSPITAL_COMMUNITY)
Admission: RE | Admit: 2015-07-23 | Discharge: 2015-07-23 | Disposition: A | Discharge status: Home / Self Care | Payer: 59 | Source: Ambulatory Visit | Attending: Physician Assistant | Admitting: Physician Assistant

## 2015-07-23 DIAGNOSIS — R1310 Dysphagia, unspecified: Secondary | ICD-10-CM | POA: Diagnosis present

## 2015-07-23 DIAGNOSIS — K219 Gastro-esophageal reflux disease without esophagitis: Secondary | ICD-10-CM | POA: Insufficient documentation

## 2015-07-23 DIAGNOSIS — K449 Diaphragmatic hernia without obstruction or gangrene: Secondary | ICD-10-CM | POA: Insufficient documentation

## 2015-07-29 ENCOUNTER — Telehealth: Payer: Self-pay

## 2015-07-29 ENCOUNTER — Ambulatory Visit (AMBULATORY_SURGERY_CENTER): Payer: Self-pay

## 2015-07-29 VITALS — Ht 73.0 in | Wt 229.8 lb

## 2015-07-29 DIAGNOSIS — R1310 Dysphagia, unspecified: Secondary | ICD-10-CM

## 2015-07-29 NOTE — Progress Notes (Signed)
No past problems with anesthesia No home oxygen No diet/weight loss meds  EGG and SOY allergy  No email; registered emmi

## 2015-07-29 NOTE — Telephone Encounter (Signed)
Allergy to EGGS!!!!!!! And soy!!!!!! Thank you, Angela/PV

## 2015-07-30 ENCOUNTER — Encounter: Payer: Self-pay | Admitting: Internal Medicine

## 2015-07-31 NOTE — Telephone Encounter (Signed)
Francisco Morrison,  Has he been tested for these allergies?  Thanks,  Cathlyn ParsonsJohn Vernelle Wisner

## 2015-08-06 ENCOUNTER — Ambulatory Visit (AMBULATORY_SURGERY_CENTER): Payer: 59 | Admitting: Internal Medicine

## 2015-08-06 ENCOUNTER — Encounter: Payer: Self-pay | Admitting: Internal Medicine

## 2015-08-06 VITALS — BP 112/69 | HR 56 | Temp 96.0°F | Resp 25 | Ht 73.0 in | Wt 229.0 lb

## 2015-08-06 DIAGNOSIS — Q394 Esophageal web: Secondary | ICD-10-CM | POA: Diagnosis not present

## 2015-08-06 DIAGNOSIS — R1310 Dysphagia, unspecified: Secondary | ICD-10-CM

## 2015-08-06 DIAGNOSIS — K222 Esophageal obstruction: Secondary | ICD-10-CM

## 2015-08-06 DIAGNOSIS — R0789 Other chest pain: Secondary | ICD-10-CM

## 2015-08-06 DIAGNOSIS — K219 Gastro-esophageal reflux disease without esophagitis: Secondary | ICD-10-CM

## 2015-08-06 MED ORDER — SODIUM CHLORIDE 0.9 % IV SOLN: 500.0000 mL | INTRAVENOUS | Status: DC

## 2015-08-06 NOTE — Progress Notes (Signed)
Called to room to assist during endoscopic procedure.  Patient ID and intended procedure confirmed with present staff. Received instructions for my participation in the procedure from the performing physician.  

## 2015-08-06 NOTE — Progress Notes (Signed)
CRNA aware of patient's egg allergy or sensitivity.

## 2015-08-06 NOTE — Op Note (Signed)
[facility] [address] [place], [id number]   ENDOSCOPY PROCEDURE REPORT  PATIENT: Francisco Morrison, Aceson  MR#: [id number] BIRTHDATE: 09-11-1965 , 49  yrs. old GENDER: male ENDOSCOPIST: Beverley FiedlerJay M Pyrtle, MD REFERRED BY:   Malva Coganody Martin, PA-C PROCEDURE DATE:  08/06/2015 PROCEDURE:  EGD, diagnostic, EGD w/ balloon dilation, and EGD w/ biopsy ASA CLASS:     Class II INDICATIONS:  heartburn, dysphagia, and belching. MEDICATIONS: Monitored anesthesia care and Propofol 300 mg IV TOPICAL ANESTHETIC: none  DESCRIPTION OF PROCEDURE: After the risks benefits and alternatives of the procedure were thoroughly explained, informed consent was obtained.  The LB [id number] endoscope was introduced through the mouth and advanced to the second portion of the duodenum , Without limitations.  The instrument was slowly withdrawn as the mucosa was fully examined.  ESOPHAGUS: A partial Schatzki's ring was found at the gastroesophageal junction.  Using a TTS-balloon the ring was dilated up to 18mm.  The balloon was held inflated for 60 seconds. Following this dilation, there was a small amount of heme.  Cold forceps biopsy was used to further disrupt the ring and samples discarded. The mucosa of the esophagus appeared otherwise normal. Biopsies were taken in the distal and mid esophagus for eosinophilic esophagitis.  STOMACH: The mucosa of the stomach appeared normal.  DUODENUM: The duodenal mucosa showed no abnormalities in the bulb and 2nd part of the duodenum.  Cold forcep biopsies were taken in the second portion.  Retroflexed views revealed no abnormalities.     The scope was then withdrawn from the patient and the procedure completed.  COMPLICATIONS: There were no immediate complications.  ENDOSCOPIC IMPRESSION: 1.   Partial Schatzki's ring was found at the gastroesophageal junction; balloon dilation 18mm; forcep biopsies to further disrupt the ring 2.   The mucosa of the  esophagus appeared normal; Biopsies taken for exclusion of eosinophilic esophagitis 3.   The mucosa of the stomach appeared normal 4.   The duodenal mucosa showed no abnormalities in the bulb and 2nd part of the duodenum; biopsies were obtained  RECOMMENDATIONS: 1.  Await biopsy results 2.  Await response to dilation 3.  Would recommend continuing Dexilant for an additional month 4.  Office follow-up next available to assess symptoms and for improvement with dysphagia   eSigned:  Beverley FiedlerJay M Pyrtle, MD 08/06/2015 10:21 AM    CC: the patient, PCP  PATIENT NAME:  Francisco Morrison, Titus MR#: [id number]

## 2015-08-06 NOTE — Patient Instructions (Signed)
Discharge instructions given. Biopsies taken. Dilatation diet given in recovery. Office will call for follow up next available. Resume previous medications. YOU HAD AN ENDOSCOPIC PROCEDURE TODAY AT THE [facility]:   Refer to the procedure report that was given to you for any specific questions about what was found during the examination.  If the procedure report does not answer your questions, please call your gastroenterologist to clarify.  If you requested that your care partner not be given the details of your procedure findings, then the procedure report has been included in a sealed envelope for you to review at your convenience later.  YOU SHOULD EXPECT: Some feelings of bloating in the abdomen. Passage of more gas than usual.  Walking can help get rid of the air that was put into your GI tract during the procedure and reduce the bloating. If you had a lower endoscopy (such as a colonoscopy or flexible sigmoidoscopy) you may notice spotting of blood in your stool or on the toilet paper. If you underwent a bowel prep for your procedure, you may not have a normal bowel movement for a few days.  Please Note:  You might notice some irritation and congestion in your nose or some drainage.  This is from the oxygen used during your procedure.  There is no need for concern and it should clear up in a day or so.  SYMPTOMS TO REPORT IMMEDIATELY:    Following upper endoscopy (EGD)  Vomiting of blood or coffee ground material  New chest pain or pain under the shoulder blades  Painful or persistently difficult swallowing  New shortness of breath  Fever of 100F or higher  Black, tarry-looking stools  For urgent or emergent issues, a gastroenterologist can be reached at any hour by calling [phone].   DIET: Your first meal following the procedure should be a small meal and then it is ok to progress to your normal diet. Heavy or fried foods are harder to digest and may make you  feel nauseous or bloated.  Likewise, meals heavy in dairy and vegetables can increase bloating.  Drink plenty of fluids but you should avoid alcoholic beverages for 24 hours.  ACTIVITY:  You should plan to take it easy for the rest of today and you should NOT DRIVE or use heavy machinery until tomorrow (because of the sedation medicines used during the test).    FOLLOW UP: Our staff will call the number listed on your records the next business day following your procedure to check on you and address any questions or concerns that you may have regarding the information given to you following your procedure. If we do not reach you, we will leave a message.  However, if you are feeling well and you are not experiencing any problems, there is no need to return our call.  We will assume that you have returned to your regular daily activities without incident.  If any biopsies were taken you will be contacted by phone or by letter within the next 1-3 weeks.  Please call us at [phone] if you have not heard about the biopsies in 3 weeks.    SIGNATURES/CONFIDENTIALITY: You and/or your care partner have signed paperwork which will be entered into your electronic medical record.  These signatures attest to the fact that that the information above on your After Visit Summary has been reviewed and is understood.  Full responsibility of the confidentiality of this discharge information lies with you and/or your  care-partner.

## 2015-08-06 NOTE — Progress Notes (Signed)
A/ox3 pleased with MAC, dentition as pre-procedure  [facility]

## 2015-08-07 ENCOUNTER — Telehealth: Payer: Self-pay | Admitting: *Deleted

## 2015-08-07 NOTE — Telephone Encounter (Signed)
Unable to reach pt. At phone number on file.

## 2015-08-08 NOTE — Telephone Encounter (Signed)
John,    No he hasn't been tested that I know of.  This is just something he has experienced in the past so he avoids these foods.                                                                                                                                                                                                                Marylene LandAngela

## 2015-08-15 ENCOUNTER — Encounter: Payer: Self-pay | Admitting: Internal Medicine

## 2015-08-23 ENCOUNTER — Ambulatory Visit (INDEPENDENT_AMBULATORY_CARE_PROVIDER_SITE_OTHER): Payer: 59 | Admitting: Physician Assistant

## 2015-08-23 ENCOUNTER — Encounter: Payer: Self-pay | Admitting: Physician Assistant

## 2015-08-23 VITALS — BP 124/73 | HR 55 | Temp 98.2°F | Ht 73.0 in | Wt 227.6 lb

## 2015-08-23 DIAGNOSIS — K219 Gastro-esophageal reflux disease without esophagitis: Secondary | ICD-10-CM | POA: Diagnosis not present

## 2015-08-23 NOTE — Progress Notes (Signed)
Patient presents to clinic today for follow-up. Patient seen last month for worsening dysphagia and GERD symptoms. Was referred to GI whom he has seen since that time. Has had EGD with dilation of Schatzki's ring. Patient endorses having a follow-up scheduled with GI on February 13th. Endorses some continued GERD symptoms -- reflux and indigestion -- despite taking Dexilant 30 mg daily. Francisco Morrison. Endorses resolution of dysphagia with the procedure performed. Denies new or worsening symptoms.  Past Medical History  Diagnosis Date  . Asthma     Current Outpatient Prescriptions on File Prior to Visit  Medication Sig Dispense Refill  . ADVAIR DISKUS 100-50 MCG/DOSE AEPB INHALE 1 PUFF BY MOUTH TWICE DAILY. RINSE MOUTH AND SPIT AFTER USE (Patient not taking: Reported on 08/23/2015) 60 each 3  . albuterol (VENTOLIN HFA) 108 (90 BASE) MCG/ACT inhaler Inhale 1-2 puffs into the lungs every 4 (four) hours as needed for wheezing or shortness of breath (MAXIMUM OF 12 PUFFS PER DAY). (Patient not taking: Reported on 08/23/2015) 18 g 2  . Dexlansoprazole 30 MG capsule Take 1 capsule (30 mg total) by mouth daily. (Patient not taking: Reported on 08/23/2015) 30 capsule 3  . fexofenadine (ALLEGRA) 180 MG tablet Take 180 mg by mouth daily as needed for allergies or rhinitis. Reported on 08/23/2015    . ibuprofen (ADVIL,MOTRIN) 800 MG tablet Reported on 08/23/2015  2  . Probiotic Product (PROBIOTIC DAILY) CAPS Take by mouth daily. Reported on 08/23/2015    . VICODIN ES 7.5-300 MG TABS Reported on 08/23/2015  0   No current facility-administered medications on file prior to visit.    Allergies  Allergen Reactions  . Dust Mite Extract   . Other     Grass, Mold, Mildew  . Shellfish Allergy   . Wheat Bran Other (See Comments)    All Wheat Products  . Eggs Or Egg-Derived Products Other (See Comments)    Sensitivity    Family History  Problem Relation Age of Onset  . Diabetes Mother   . Hypertension Father   . Heart disease  Father   . Diabetes Brother   . Diabetes Maternal Grandmother   . Cancer Paternal Grandfather     Brain Tumor  . Colon cancer Neg Hx     Social History   Social History  . Marital Status: Legally Separated    Spouse Name: N/A  . Number of Children: N/A  . Years of Education: N/A   Social History Main Topics  . Smoking status: Never Smoker   . Smokeless tobacco: Never Used  . Alcohol Use: No  . Drug Use: No  . Sexual Activity: No   Other Topics Concern  . None   Social History Narrative   Review of Systems - See HPI.  All other ROS are negative.  BP 124/73 mmHg  Pulse 55  Temp(Src) 98.2 F (36.8 C) (Oral)  Ht 6\' 1"  (1.854 m)  Wt 227 lb 9.6 oz (103.239 kg)  BMI 30.03 kg/m2  SpO2 98%  Physical Exam  Constitutional: He is oriented to person, place, and time and well-developed, well-nourished, and in no distress.  HENT:  Head: Normocephalic and atraumatic.  Eyes: Conjunctivae are normal.  Cardiovascular: Normal rate, regular rhythm, normal heart sounds and intact distal pulses.   Pulmonary/Chest: Effort normal.  Neurological: He is alert and oriented to person, place, and time.  Psychiatric: Affect normal.  Vitals reviewed.   Recent Results (from the past 2160 hour(s))  IgA  Status: None   Collection Time: 07/19/15 11:37 AM  Result Value Ref Range   IgA 220 68 - 378 mg/dL    Comment:    Tissue transglutaminase, IgA     Status: None   Collection Time: 07/19/15 11:37 AM  Result Value Ref Range   Tissue Transglutaminase Ab, IgA 1 <4 U/mL    Comment: Value Interpretation:   <4:   Antibody Not Detected  >=4:   Antibody Detected     Assessment/Plan: Gastroesophageal reflux disease without esophagitis Continue Dexilant as directed. Resume probiotic daily. If no major improvement before GI appointment, can increase to 60 mg Dexilant. Follow-up with GI as scheduled.

## 2015-08-23 NOTE — Progress Notes (Signed)
Pre visit review using our clinic review tool, if applicable. No additional management support is needed unless otherwise documented below in the visit note. 

## 2015-08-25 NOTE — Patient Instructions (Signed)
EPIC down at time of visit. AVS typed on Word Document and printed.

## 2015-08-25 NOTE — Assessment & Plan Note (Signed)
Continue Dexilant as directed. Resume probiotic daily. If no major improvement before GI appointment, can increase to 60 mg Dexilant. Follow-up with GI as scheduled.

## 2015-09-20 ENCOUNTER — Encounter: Payer: Self-pay | Admitting: *Deleted

## 2015-09-20 ENCOUNTER — Ambulatory Visit (INDEPENDENT_AMBULATORY_CARE_PROVIDER_SITE_OTHER): Payer: 59 | Admitting: Psychology

## 2015-09-20 DIAGNOSIS — F4323 Adjustment disorder with mixed anxiety and depressed mood: Secondary | ICD-10-CM

## 2015-09-30 ENCOUNTER — Encounter: Payer: Self-pay | Admitting: Internal Medicine

## 2015-09-30 ENCOUNTER — Ambulatory Visit (INDEPENDENT_AMBULATORY_CARE_PROVIDER_SITE_OTHER): Payer: 59 | Admitting: Internal Medicine

## 2015-09-30 VITALS — BP 124/68 | HR 72 | Ht 73.0 in | Wt 231.0 lb

## 2015-09-30 DIAGNOSIS — K224 Dyskinesia of esophagus: Secondary | ICD-10-CM | POA: Diagnosis not present

## 2015-09-30 DIAGNOSIS — K222 Esophageal obstruction: Secondary | ICD-10-CM

## 2015-09-30 DIAGNOSIS — Q394 Esophageal web: Secondary | ICD-10-CM

## 2015-09-30 DIAGNOSIS — K219 Gastro-esophageal reflux disease without esophagitis: Secondary | ICD-10-CM

## 2015-09-30 DIAGNOSIS — R0789 Other chest pain: Secondary | ICD-10-CM

## 2015-09-30 MED ORDER — DILTIAZEM HCL 30 MG PO TABS: 30.0000 mg | ORAL_TABLET | Freq: Three times a day (TID) | ORAL | Status: DC | PRN

## 2015-09-30 NOTE — Progress Notes (Signed)
Subjective:    Patient ID: Francisco Morrison, male    DOB: October 21, 1965, 50 y.o.   MRN: [id number]  HPI Francisco Morrison is a 50 year old male with a past medical history of GERD, atypical chest pain and dysphagia who is here for follow-up. He was initially seen by Gay Filler, PA-C on 07/19/2015. At that time he was having issues with heartburn which had improved with initiation of the excellent. Upper endoscopy was arranged and performed on 08/06/2015. This revealed a partial Schatzki's ring dilated to 18 mm with balloon. Forcep biopsies were taken to further disrupt the ring. Esophagus otherwise appeared normal and biopsies were obtained to exclude EoE. The gastric and duodenal mucosa appeared normal. Esophagus biopsies were normal without evidence of EoE or inflammation. Small bowel biopsies were normal. Barium swallow study was also performed which showed mild impression in the posterior aspect of the esophagus by anterior osteophyte at C4-5 and C6-7 level. There was normal primary esophageal stripping wave without obstructing lesion. In a transient small sliding hiatal hernia with smooth narrowing. GERD was elicited to the thoracic esophagus. 13 mm tablet passed without problem.  His reflux and heartburn has improved dramatically. He was formally a runner running about 30 miles per week but over the past year reflux and heartburn had been severe which has prevented him from running. He has not yet started running back except for the last couple of days doing short distances on the treadmill. He continues to have an intermittent "tugging" in his chest. This varies in intensity and in time. Not always related to eating but seems to be more frequent after eating. Can be associated with the nausea. After the Super Bowl he had an episode of tugging in his chest near the xiphoid associated with nausea and loose stools. Loose stools had not previously been related. Today prior to this visit he had a tugging in his  Center mid chest which is mild and persistent even now. No known trigger. He tells a story of one year ago in February 2016 he developed severe tugging mid chest pain and he thought he was going to "die". He reports EKG was performed but no formal cardiology evaluation  His father does have a history of vascular disease and heart arrhythmia requiring ablation and pacemaker placement  Review of Systems As per history of present illness, otherwise negative  Current Medications, Allergies, Past Medical History, Past Surgical History, Family History and Social History were reviewed in [employer] record.     Objective:   Physical Exam BP 124/68 mmHg  Pulse 72  Ht  (1.854 m)  Wt 231 lb (104.781 kg)  BMI 30.48 kg/m2 Constitutional: Well-developed and well-nourished. No distress. HEENT: Normocephalic and atraumatic.Conjunctivae are normal.  No scleral icterus. Neck: Neck supple. Trachea midline. Cardiovascular: Normal rate, regular rhythm and intact distal pulses. No M/R/G Pulmonary/chest: Effort normal and breath sounds normal. No wheezing, rales or rhonchi. Abdominal: Soft, nontender, nondistended. Bowel sounds active throughout.  Extremities: no clubbing, cyanosis, or edema Neurological: Alert and oriented to person place and time. Skin: Skin is warm and dry. Psychiatric: Normal mood and affect. Behavior is normal.  CBC    Component Value Date/Time   WBC 5.0 11/07/2014 0805   RBC 4.75 11/07/2014 0805   HGB 15.7 11/07/2014 0805   HCT 44.9 11/07/2014 0805   PLT 171.0 11/07/2014 0805   MCV 94.4 11/07/2014 0805   MCH 32.0 04/27/2014 1623   MCHC 34.9 11/07/2014 0805   RDW  12.7 11/07/2014 0805   LYMPHSABS 1.1 10/08/2014 1415   MONOABS 0.3 10/08/2014 1415   EOSABS 0.0 10/08/2014 1415   BASOSABS 0.0 10/08/2014 1415    CMP     Component Value Date/Time   NA 138 11/07/2014 0805   K 3.9 11/07/2014 0805   CL 106 11/07/2014 0805   CO2 27 11/07/2014 0805     GLUCOSE 99 11/07/2014 0805   BUN 15 11/07/2014 0805   CREATININE 0.97 11/07/2014 0805   CREATININE 0.95 04/27/2014 1623   CALCIUM 9.0 11/07/2014 0805   PROT 6.7 11/07/2014 0805   ALBUMIN 4.2 11/07/2014 0805   AST 23 11/07/2014 0805   ALT 20 11/07/2014 0805   ALKPHOS 50 11/07/2014 0805   BILITOT 1.5* 11/07/2014 0805    Barium swallow, EGD as per HPI    Assessment & Plan:   50 year old male with a past medical history of GERD, atypical chest pain and dysphagia who is here for follow-up.  1. Atypical chest pain -- intermittent tugging sensation varying in intensity. This certainly could be an element of esophageal spasm but I would like to also refer him to cardiology to exclude cardiac related chest pain. He was formally a runner but has been unable to run recently which I feel warrants further evaluation. He is in full agreement and he is being referred to [facility]. I'm going to start him on diltiazem 30 mg 3 times daily on an as-needed basis for now for possible esophageal spasm. He is instructed to take this medication if he feels the tugging sensation in his chest. He may need this on a scheduled basis but we will see how he responds on an as-needed basis first. The dose may need to be escalated if it is partially effective. See #2  2. GERD with Schatzki's ring -- no true solid food dysphasia after dilation. GERD and heartburn has been dramatically better on DEXA when. Continue Dexilant 60 mg daily. If the chest pain as discussed in #1 is indeed esophageal spasm, controlling reflux is very important. GERD diet  3. Colorectal cancer screening -- he has recently turned 50 and is now time for colorectal cancer screening. Average risk. I recommended screening colonoscopy but he would like to complete the evaluation of atypical chest pain first. This is certainly reasonable. Recall placed for colonoscopy later this year.  I have asked that he call me after trial of  diltiazem and cardiac evaluation. Otherwise follow-up in 4 months

## 2015-09-30 NOTE — Patient Instructions (Addendum)
You have been scheduled to see Francisco Morrison at [facility] on 10/11/15 at 9:00 am. Their office is located at [address], [address].  We have sent the following medications to your pharmacy for you to pick up at your convenience: Diltiazem 30 mg three times daily as needed  Please continue Dexilant 60 mg daily.  Call our office after you see Francisco Morrison with an update on how your medications are working.  Please follow up with Dr Francisco Morrison in 4 months.  You Francisco be due for a recall colonoscopy in 02/2016. We Francisco send you a reminder in the mail when it gets closer to that time.

## 2015-10-02 ENCOUNTER — Ambulatory Visit (INDEPENDENT_AMBULATORY_CARE_PROVIDER_SITE_OTHER): Payer: 59 | Admitting: Psychology

## 2015-10-02 DIAGNOSIS — F4323 Adjustment disorder with mixed anxiety and depressed mood: Secondary | ICD-10-CM

## 2015-10-08 NOTE — Progress Notes (Signed)
Cardiology Office Note   Date:  10/11/2015   ID:  Francisco Morrison, DOB 01/21/1966, MRN [id number]  PCP:  Piedad Climes, PA-C  Cardiologist:   Juell Radney Jorja Loa, MD    Chief Complaint  Patient presents with  . Chest Pain     History of Present Illness: Francisco Morrison is a 50 y.o. male who presents today for cardiology evaluation.   He was formally a runner but has not over the last year due to reflux and heartburn.  He has tried short distances on the treadmill. He has noted an intermittent tugging in his chest.  It varies in intensity and time.  Possibly more frequent after eating.  He says that it usually happens when he runs. He was running 30 miles a week recently but is cutting down and is not running at this time. He says that the pain and discomfort is associated with belching and a acid type feeling in his chest. Last February he was at [employer], and got a pulling sensation in his chest that was associated with him feeling faint. He did not pass out at the time. Within the last month he has had similar pulling sensations in his chest. They have lasted up to 8 hours, and he is taking diltiazem which is improved discomfort. He usually lays down to sleep when this happens and it is gone when he wakes up. He has had endoscopy in the past that showed a Schatzki's ring which was cut and dilated in the past.  Today, he denies symptoms of palpitations, shortness of breath, orthopnea, PND, lower extremity edema, claudication, dizziness, presyncope, syncope, bleeding, or neurologic sequela. The patient is tolerating medications without difficulties and is otherwise without complaint today.    Past Medical History  Diagnosis Date  . Asthma   . Vitamin D deficiency   . Anxiety   . Schatzki's ring   . Sliding hiatal hernia   . GERD (gastroesophageal reflux disease)    Past Surgical History  Procedure Laterality Date  . Wisdom tooth extraction    . Tooth extraction        Current Outpatient Prescriptions  Medication Sig Dispense Refill  . ADVAIR DISKUS 100-50 MCG/DOSE AEPB INHALE 1 PUFF BY MOUTH TWICE DAILY. RINSE MOUTH AND SPIT AFTER USE 60 each 3  . albuterol (VENTOLIN HFA) 108 (90 BASE) MCG/ACT inhaler Inhale 1-2 puffs into the lungs every 4 (four) hours as needed for wheezing or shortness of breath (MAXIMUM OF 12 PUFFS PER DAY). 18 g 2  . Dexlansoprazole 30 MG capsule Take 1 capsule (30 mg total) by mouth daily. 30 capsule 3  . diltiazem (CARDIZEM) 30 MG tablet Take 1 tablet (30 mg total) by mouth 3 (three) times daily as needed. 90 tablet 1  . fexofenadine (ALLEGRA) 180 MG tablet Take 180 mg by mouth daily as needed for allergies or rhinitis. Reported on 08/23/2015    . Probiotic Product (PROBIOTIC DAILY) CAPS Take by mouth daily. Reported on 08/23/2015     No current facility-administered medications for this visit.    Allergies:   Dust mite extract; Other; Shellfish allergy; Wheat bran; and Eggs or egg-derived products   Social History:  The patient  reports that he has never smoked. He has never used smokeless tobacco. He reports that he does not drink alcohol or use illicit drugs.   Family History:  The patient's family history includes Cancer in his paternal grandfather; Diabetes in his brother, maternal grandmother, and mother;  Heart disease in his father; Hypertension in his father. There is no history of Colon cancer.    ROS:  Please see the history of present illness.   Otherwise, review of systems is positive forweight gain, fatigue, chest pain, swelling, dyspnea on exertion, nausea vomiting, anxiety, dizziness.   All other systems are reviewed and negative.    PHYSICAL EXAM: VS:  BP 122/84 mmHg  Pulse 64  Ht  (1.854 m)  Wt 234 lb 3.2 oz (106.232 kg)  BMI 30.91 kg/m2 , BMI Body mass index is 30.91 kg/(m^2). GEN: Well nourished, well developed, in no acute distress HEENT: normal Neck: no JVD, carotid bruits, or masses Cardiac:  RRR; no murmurs, rubs, or gallops,no edema  Respiratory:  clear to auscultation bilaterally, normal work of breathing GI: soft, nontender, nondistended, + BS MS: no deformity or atrophy Skin: warm and dry Neuro:  Strength and sensation are intact Psych: euthymic mood, full affect  EKG:  EKG is not ordered today. The ekg ordered 2/22shows Sinus rhythm, no ischemic changes  Recent Labs: 11/07/2014: ALT 20; BUN 15; Creatinine, Ser 0.97; Hemoglobin 15.7; Platelets 171.0; Potassium 3.9; Sodium 138; TSH 2.73    Lipid Panel     Component Value Date/Time   CHOL 154 11/07/2014 0805   TRIG 83.0 11/07/2014 0805   HDL 43.00 11/07/2014 0805   CHOLHDL 4 11/07/2014 0805   VLDL 16.6 11/07/2014 0805   LDLCALC 94 11/07/2014 0805     Wt Readings from Last 3 Encounters:  10/11/15 234 lb 3.2 oz (106.232 kg)  09/30/15 231 lb (104.781 kg)  08/23/15 227 lb 9.6 oz (103.239 kg)   ASSESSMENT AND PLAN:  1.  Chest pain:  At this time, his chest pain seems atypical for cardiac chest pain.  He says that the pain has been reflux related in the past.  He does have a pulling type sensation in his chest which is improved with diltiazem.  Although his pain is atypical in nature, he feels that further testing would make him feel much better about the results moving forward.  Darchelle Nunes therefore plan for stress myoview to determine if he has any evidence of CAD.   Current medicines are reviewed at length with the patient today.   The patient does not have concerns regarding his medicines.  The following changes were made today:  none  Labs/ tests ordered today include:  No orders of the defined types were placed in this encounter.     Disposition:   FU with Shateria Paternostro PRN pending [employer], Bailei Buist Jorja Loa, MD  10/11/2015 9:16 AM     [facility] [address] [place] [address] [phone] (office) [phone] (fax)

## 2015-10-11 ENCOUNTER — Ambulatory Visit (INDEPENDENT_AMBULATORY_CARE_PROVIDER_SITE_OTHER): Payer: 59 | Admitting: Cardiology

## 2015-10-11 ENCOUNTER — Encounter: Payer: Self-pay | Admitting: Cardiology

## 2015-10-11 VITALS — BP 122/84 | HR 64 | Ht 73.0 in | Wt 234.2 lb

## 2015-10-11 DIAGNOSIS — R0609 Other forms of dyspnea: Secondary | ICD-10-CM | POA: Diagnosis not present

## 2015-10-11 DIAGNOSIS — R0789 Other chest pain: Secondary | ICD-10-CM | POA: Diagnosis not present

## 2015-10-11 NOTE — Patient Instructions (Signed)
Medication Instructions:  Your physician recommends that you continue on your current medications as directed. Please refer to the Current Medication list given to you today.  Labwork: None ordered  Testing/Procedures: Your physician has requested that you have en exercise stress myoview. For further information please visit [email or web address] Please follow instruction sheet, as given.  Follow-Up: No follow up with cardiology is needed at this time.  We will determine if further follow up is needed, with general cardiology, after stress testing.  If you need a refill on your cardiac medications before your next appointment, please call your pharmacy.  Thank you for choosing [facility]!!

## 2015-10-15 ENCOUNTER — Telehealth: Payer: Self-pay | Admitting: Physician Assistant

## 2015-10-15 NOTE — Telephone Encounter (Signed)
Documented in Health Maintenance.

## 2015-10-15 NOTE — Telephone Encounter (Signed)
Pt states he does not take the flu shot °

## 2015-10-16 ENCOUNTER — Ambulatory Visit (INDEPENDENT_AMBULATORY_CARE_PROVIDER_SITE_OTHER): Payer: 59 | Admitting: Psychology

## 2015-10-16 DIAGNOSIS — F4323 Adjustment disorder with mixed anxiety and depressed mood: Secondary | ICD-10-CM

## 2015-10-17 ENCOUNTER — Telehealth (HOSPITAL_COMMUNITY): Payer: Self-pay | Admitting: *Deleted

## 2015-10-17 NOTE — Telephone Encounter (Signed)
Attempted to call regarding upcoming appointment- no answer. Stephen Baruch J Miosha Behe, RN 

## 2015-10-20 ENCOUNTER — Other Ambulatory Visit: Payer: Self-pay | Admitting: Physician Assistant

## 2015-10-21 ENCOUNTER — Telehealth (HOSPITAL_COMMUNITY): Payer: Self-pay | Admitting: *Deleted

## 2015-10-21 ENCOUNTER — Other Ambulatory Visit: Payer: Self-pay | Admitting: *Deleted

## 2015-10-21 DIAGNOSIS — R079 Chest pain, unspecified: Secondary | ICD-10-CM

## 2015-10-21 DIAGNOSIS — R0609 Other forms of dyspnea: Secondary | ICD-10-CM

## 2015-10-21 NOTE — Telephone Encounter (Signed)
Attempted to call regarding upcoming appointment- no answer. Dorien Mayotte J Chela Sutphen, RN 

## 2015-10-22 ENCOUNTER — Encounter (HOSPITAL_COMMUNITY): Payer: Self-pay

## 2015-10-31 ENCOUNTER — Telehealth: Payer: 59 | Admitting: Physician Assistant

## 2015-10-31 ENCOUNTER — Ambulatory Visit (INDEPENDENT_AMBULATORY_CARE_PROVIDER_SITE_OTHER): Payer: 59

## 2015-10-31 DIAGNOSIS — R079 Chest pain, unspecified: Secondary | ICD-10-CM

## 2015-10-31 DIAGNOSIS — R0609 Other forms of dyspnea: Secondary | ICD-10-CM | POA: Diagnosis not present

## 2015-10-31 DIAGNOSIS — B9689 Other specified bacterial agents as the cause of diseases classified elsewhere: Secondary | ICD-10-CM

## 2015-10-31 DIAGNOSIS — J019 Acute sinusitis, unspecified: Secondary | ICD-10-CM

## 2015-10-31 LAB — EXERCISE TOLERANCE TEST
CHL CUP STRESS STAGE 1 GRADE: 0 %
CHL CUP STRESS STAGE 2 SPEED: 1 mph
CHL CUP STRESS STAGE 3 HR: 87 {beats}/min
CHL CUP STRESS STAGE 3 SPEED: 1 mph
CHL CUP STRESS STAGE 5 GRADE: 12 %
CHL CUP STRESS STAGE 5 SPEED: 2.5 mph
CHL CUP STRESS STAGE 6 HR: 148 {beats}/min
CHL CUP STRESS STAGE 6 SBP: 182 mmHg
CHL CUP STRESS STAGE 6 SPEED: 3.4 mph
CHL CUP STRESS STAGE 7 GRADE: 16 %
CHL CUP STRESS STAGE 8 DBP: 83 mmHg
CHL CUP STRESS STAGE 8 HR: 139 {beats}/min
CHL CUP STRESS STAGE 9 GRADE: 0 %
CHL CUP STRESS STAGE 9 SPEED: 0 mph
CSEPED: 10 min
CSEPEDS: 0 s
CSEPEW: 11.6 METS
CSEPHR: 91 %
CSEPPHR: 153 {beats}/min
CSEPPMHR: 90 %
MPHR: 170 {beats}/min
RPE: 17
Rest HR: 77 {beats}/min
Stage 1 DBP: 97 mmHg
Stage 1 HR: 90 {beats}/min
Stage 1 SBP: 117 mmHg
Stage 1 Speed: 0 mph
Stage 2 Grade: 0 %
Stage 2 HR: 90 {beats}/min
Stage 3 Grade: 0 %
Stage 4 Grade: 10 %
Stage 4 HR: 111 {beats}/min
Stage 4 Speed: 1.7 mph
Stage 5 DBP: 78 mmHg
Stage 5 HR: 136 {beats}/min
Stage 5 SBP: 172 mmHg
Stage 6 DBP: 82 mmHg
Stage 6 Grade: 14 %
Stage 7 HR: 153 {beats}/min
Stage 7 Speed: 4.1 mph
Stage 8 Grade: 0 %
Stage 8 SBP: 165 mmHg
Stage 8 Speed: 0.2 mph
Stage 9 DBP: 87 mmHg
Stage 9 HR: 88 {beats}/min
Stage 9 SBP: 137 mmHg

## 2015-10-31 MED ORDER — AMOXICILLIN-POT CLAVULANATE 875-125 MG PO TABS: 1.0000 | ORAL_TABLET | Freq: Two times a day (BID) | ORAL | Status: DC

## 2015-10-31 NOTE — Progress Notes (Signed)

## 2015-11-06 ENCOUNTER — Ambulatory Visit (INDEPENDENT_AMBULATORY_CARE_PROVIDER_SITE_OTHER): Payer: 59 | Admitting: Psychology

## 2015-11-06 DIAGNOSIS — F4323 Adjustment disorder with mixed anxiety and depressed mood: Secondary | ICD-10-CM

## 2015-11-07 ENCOUNTER — Telehealth: Payer: Self-pay

## 2015-11-08 ENCOUNTER — Encounter: Payer: Self-pay | Admitting: Physician Assistant

## 2015-11-08 ENCOUNTER — Ambulatory Visit (INDEPENDENT_AMBULATORY_CARE_PROVIDER_SITE_OTHER): Payer: 59 | Admitting: Physician Assistant

## 2015-11-08 VITALS — BP 120/78 | HR 71 | Temp 98.0°F | Ht 73.0 in | Wt 232.8 lb

## 2015-11-08 DIAGNOSIS — K219 Gastro-esophageal reflux disease without esophagitis: Secondary | ICD-10-CM | POA: Diagnosis not present

## 2015-11-08 DIAGNOSIS — Z0000 Encounter for general adult medical examination without abnormal findings: Secondary | ICD-10-CM

## 2015-11-08 DIAGNOSIS — Z125 Encounter for screening for malignant neoplasm of prostate: Secondary | ICD-10-CM | POA: Diagnosis not present

## 2015-11-08 LAB — CBC
HCT: 44.6 % (ref 39.0–52.0)
Hemoglobin: 15.3 g/dL (ref 13.0–17.0)
MCH: 32.2 pg (ref 26.0–34.0)
MCHC: 34.3 g/dL (ref 30.0–36.0)
MCV: 93.9 fL (ref 78.0–100.0)
MPV: 10.2 fL (ref 8.6–12.4)
PLATELETS: 188 10*3/uL (ref 150–400)
RBC: 4.75 MIL/uL (ref 4.22–5.81)
RDW: 13 % (ref 11.5–15.5)
WBC: 5.1 10*3/uL (ref 4.0–10.5)

## 2015-11-08 LAB — TSH: TSH: 2.01 m[IU]/L (ref 0.40–4.50)

## 2015-11-08 LAB — HEMOGLOBIN A1C
HEMOGLOBIN A1C: 5.5 % (ref <5.7)
Mean Plasma Glucose: 111 mg/dL (ref <117)

## 2015-11-08 NOTE — Progress Notes (Signed)
Pre visit review using our clinic review tool, if applicable. No additional management support is needed unless otherwise documented below in the visit note. 

## 2015-11-08 NOTE — Patient Instructions (Signed)
Please go to the lab for blood work.  I will call you with your results. If your blood work is normal we will follow-up yearly for physicals.  If anything is abnormal we will treat as directed.  Please continue medications as directed.  Preventive Care for Adults, Male A healthy lifestyle and preventive care can promote health and wellness. Preventive health guidelines for men include the following key practices:  A routine yearly physical is a good way to check with your health care provider about your health and preventative screening. It is a chance to share any concerns and updates on your health and to receive a thorough exam.  Visit your dentist for a routine exam and preventative care every 6 months. Brush your teeth twice a day and floss once a day. Good oral hygiene prevents tooth decay and gum disease.  The frequency of eye exams is based on your age, health, family medical history, use of contact lenses, and other factors. Follow your health care provider's recommendations for frequency of eye exams.  Eat a healthy diet. Foods such as vegetables, fruits, whole grains, low-fat dairy products, and lean protein foods contain the nutrients you need without too many calories. Decrease your intake of foods high in solid fats, added sugars, and salt. Eat the right amount of calories for you.Get information about a proper diet from your health care provider, if necessary.  Regular physical exercise is one of the most important things you can do for your health. Most adults should get at least 150 minutes of moderate-intensity exercise (any activity that increases your heart rate and causes you to sweat) each week. In addition, most adults need muscle-strengthening exercises on 2 or more days a week.  Maintain a healthy weight. The body mass index (BMI) is a screening tool to identify possible weight problems. It provides an estimate of body fat based on height and weight. Your health care  provider can find your BMI and can help you achieve or maintain a healthy weight.For adults 20 years and older:  A BMI below 18.5 is considered underweight.  A BMI of 18.5 to 24.9 is normal.  A BMI of 25 to 29.9 is considered overweight.  A BMI of 30 and above is considered obese.  Maintain normal blood lipids and cholesterol levels by exercising and minimizing your intake of saturated fat. Eat a balanced diet with plenty of fruit and vegetables. Blood tests for lipids and cholesterol should begin at age 77 and be repeated every 5 years. If your lipid or cholesterol levels are high, you are over 50, or you are at high risk for heart disease, you may need your cholesterol levels checked more frequently.Ongoing high lipid and cholesterol levels should be treated with medicines if diet and exercise are not working.  If you smoke, find out from your health care provider how to quit. If you do not use tobacco, do not start.  Lung cancer screening is recommended for adults aged 56-80 years who are at high risk for developing lung cancer because of a history of smoking. A yearly low-dose CT scan of the lungs is recommended for people who have at least a 30-pack-year history of smoking and are a current smoker or have quit within the past 15 years. A pack year of smoking is smoking an average of 1 pack of cigarettes a day for 1 year (for example: 1 pack a day for 30 years or 2 packs a day for 15 years). Yearly screening  should continue until the smoker has stopped smoking for at least 15 years. Yearly screening should be stopped for people who develop a health problem that would prevent them from having lung cancer treatment.  If you choose to drink alcohol, do not have more than 2 drinks per day. One drink is considered to be 12 ounces (355 mL) of beer, 5 ounces (148 mL) of wine, or 1.5 ounces (44 mL) of liquor.  Avoid use of street drugs. Do not share needles with anyone. Ask for help if you need  support or instructions about stopping the use of drugs.  High blood pressure causes heart disease and increases the risk of stroke. Your blood pressure should be checked at least every 1-2 years. Ongoing high blood pressure should be treated with medicines, if weight loss and exercise are not effective.  If you are 41-67 years old, ask your health care provider if you should take aspirin to prevent heart disease.  Diabetes screening is done by taking a blood sample to check your blood glucose level after you have not eaten for a certain period of time (fasting). If you are not overweight and you do not have risk factors for diabetes, you should be screened once every 3 years starting at age 68. If you are overweight or obese and you are 36-67 years of age, you should be screened for diabetes every year as part of your cardiovascular risk assessment.  Colorectal cancer can be detected and often prevented. Most routine colorectal cancer screening begins at the age of 47 and continues through age 24. However, your health care provider may recommend screening at an earlier age if you have risk factors for colon cancer. On a yearly basis, your health care provider may provide home test kits to check for hidden blood in the stool. Use of a small camera at the end of a tube to directly examine the colon (sigmoidoscopy or colonoscopy) can detect the earliest forms of colorectal cancer. Talk to your health care provider about this at age 41, when routine screening begins. Direct exam of the colon should be repeated every 5-10 years through age 16, unless early forms of precancerous polyps or small growths are found.  People who are at an increased risk for hepatitis B should be screened for this virus. You are considered at high risk for hepatitis B if:  You were born in a country where hepatitis B occurs often. Talk with your health care provider about which countries are considered high risk.  Your parents  were born in a high-risk country and you have not received a shot to protect against hepatitis B (hepatitis B vaccine).  You have HIV or AIDS.  You use needles to inject street drugs.  You live with, or have sex with, someone who has hepatitis B.  You are a man who has sex with other men (MSM).  You get hemodialysis treatment.  You take certain medicines for conditions such as cancer, organ transplantation, and autoimmune conditions.  Hepatitis C blood testing is recommended for all people born from [birth year] through 1965 and any individual with known risks for hepatitis C.  Practice safe sex. Use condoms and avoid high-risk sexual practices to reduce the spread of sexually transmitted infections (STIs). STIs include gonorrhea, chlamydia, syphilis, trichomonas, herpes, HPV, and human immunodeficiency virus (HIV). Herpes, HIV, and HPV are viral illnesses that have no cure. They can result in disability, cancer, and death.  If you are a man who has  sex with other men, you should be screened at least once per year for:  HIV.  Urethral, rectal, and pharyngeal infection of gonorrhea, chlamydia, or both.  If you are at risk of being infected with HIV, it is recommended that you take a prescription medicine daily to prevent HIV infection. This is called preexposure prophylaxis (PrEP). You are considered at risk if:  You are a man who has sex with other men (MSM) and have other risk factors.  You are a heterosexual man, are sexually active, and are at increased risk for HIV infection.  You take drugs by injection.  You are sexually active with a partner who has HIV.  Talk with your health care provider about whether you are at high risk of being infected with HIV. If you choose to begin PrEP, you should first be tested for HIV. You should then be tested every 3 months for as long as you are taking PrEP.  A one-time screening for abdominal aortic aneurysm (AAA) and surgical repair of large AAAs  by ultrasound are recommended for men ages 54 to 31 years who are current or former smokers.  Healthy men should no longer receive prostate-specific antigen (PSA) blood tests as part of routine cancer screening. Talk with your health care provider about prostate cancer screening.  Testicular cancer screening is not recommended for adult males who have no symptoms. Screening includes self-exam, a health care provider exam, and other screening tests. Consult with your health care provider about any symptoms you have or any concerns you have about testicular cancer.  Use sunscreen. Apply sunscreen liberally and repeatedly throughout the day. You should seek shade when your shadow is shorter than you. Protect yourself by wearing long sleeves, pants, a wide-brimmed hat, and sunglasses year round, whenever you are outdoors.  Once a month, do a whole-body skin exam, using a mirror to look at the skin on your back. Tell your health care provider about new moles, moles that have irregular borders, moles that are larger than a pencil eraser, or moles that have changed in shape or color.  Stay current with required vaccines (immunizations).  Influenza vaccine. All adults should be immunized every year.  Tetanus, diphtheria, and acellular pertussis (Td, Tdap) vaccine. An adult who has not previously received Tdap or who does not know his vaccine status should receive 1 dose of Tdap. This initial dose should be followed by tetanus and diphtheria toxoids (Td) booster doses every 10 years. Adults with an unknown or incomplete history of completing a 3-dose immunization series with Td-containing vaccines should begin or complete a primary immunization series including a Tdap dose. Adults should receive a Td booster every 10 years.  Varicella vaccine. An adult without evidence of immunity to varicella should receive 2 doses or a second dose if he has previously received 1 dose.  Human papillomavirus (HPV) vaccine.  Males aged 11-21 years who have not received the vaccine previously should receive the 3-dose series. Males aged 22-26 years may be immunized. Immunization is recommended through the age of 70 years for any male who has sex with males and did not get any or all doses earlier. Immunization is recommended for any person with an immunocompromised condition through the age of 16 years if he did not get any or all doses earlier. During the 3-dose series, the second dose should be obtained 4-8 weeks after the first dose. The third dose should be obtained 24 weeks after the first dose and 16 weeks after  the second dose.  Zoster vaccine. One dose is recommended for adults aged 60 years or older unless certain conditions are present.  Measles, mumps, and rubella (MMR) vaccine. Adults born before 1957 generally are considered immune to measles and mumps. Adults born in 1957 or later should have 1 or more doses of MMR vaccine unless there is a contraindication to the vaccine or there is laboratory evidence of immunity to each of the three diseases. A routine second dose of MMR vaccine should be obtained at least 28 days after the first dose for students attending postsecondary schools, health care workers, or international travelers. People who received inactivated measles vaccine or an unknown type of measles vaccine during 1963-1967 should receive 2 doses of MMR vaccine. People who received inactivated mumps vaccine or an unknown type of mumps vaccine before 1979 and are at high risk for mumps infection should consider immunization with 2 doses of MMR vaccine. Unvaccinated health care workers born before 1957 who lack laboratory evidence of measles, mumps, or rubella immunity or laboratory confirmation of disease should consider measles and mumps immunization with 2 doses of MMR vaccine or rubella immunization with 1 dose of MMR vaccine.  Pneumococcal 13-valent conjugate (PCV13) vaccine. When indicated, a person who is  uncertain of his immunization history and has no record of immunization should receive the PCV13 vaccine. All adults 65 years of age and older should receive this vaccine. An adult aged 19 years or older who has certain medical conditions and has not been previously immunized should receive 1 dose of PCV13 vaccine. This PCV13 should be followed with a dose of pneumococcal polysaccharide (PPSV23) vaccine. Adults who are at high risk for pneumococcal disease should obtain the PPSV23 vaccine at least 8 weeks after the dose of PCV13 vaccine. Adults older than 50 years of age who have normal immune system function should obtain the PPSV23 vaccine dose at least 1 year after the dose of PCV13 vaccine.  Pneumococcal polysaccharide (PPSV23) vaccine. When PCV13 is also indicated, PCV13 should be obtained first. All adults aged 65 years and older should be immunized. An adult younger than age 65 years who has certain medical conditions should be immunized. Any person who resides in a nursing home or long-term care facility should be immunized. An adult smoker should be immunized. People with an immunocompromised condition and certain other conditions should receive both PCV13 and PPSV23 vaccines. People with human immunodeficiency virus (HIV) infection should be immunized as soon as possible after diagnosis. Immunization during chemotherapy or radiation therapy should be avoided. Routine use of PPSV23 vaccine is not recommended for American Indians, [place] Natives, or people younger than 65 years unless there are medical conditions that require PPSV23 vaccine. When indicated, people who have unknown immunization and have no record of immunization should receive PPSV23 vaccine. One-time revaccination 5 years after the first dose of PPSV23 is recommended for people aged 19-64 years who have chronic kidney failure, nephrotic syndrome, asplenia, or immunocompromised conditions. People who received 1-2 doses of PPSV23 before age  65 years should receive another dose of PPSV23 vaccine at age 65 years or later if at least 5 years have passed since the previous dose. Doses of PPSV23 are not needed for people immunized with PPSV23 at or after age 65 years.  Meningococcal vaccine. Adults with asplenia or persistent complement component deficiencies should receive 2 doses of quadrivalent meningococcal conjugate (MenACWY-D) vaccine. The doses should be obtained at least 2 months apart. Microbiologists working with certain meningococcal bacteria,   military recruits, people at risk during an outbreak, and people who travel to or live in countries with a high rate of meningitis should be immunized. A first-year college student up through age 21 years who is living in a residence hall should receive a dose if he did not receive a dose on or after his 16th birthday. Adults who have certain high-risk conditions should receive one or more doses of vaccine.  Hepatitis A vaccine. Adults who wish to be protected from this disease, have chronic liver disease, work with hepatitis A-infected animals, work in hepatitis A research labs, or travel to or work in countries with a high rate of hepatitis A should be immunized. Adults who were previously unvaccinated and who anticipate close contact with an international adoptee during the first 60 days after arrival in the [place] from a country with a high rate of hepatitis A should be immunized.  Hepatitis B vaccine. Adults should be immunized if they wish to be protected from this disease, are under age 59 years and have diabetes, have chronic liver disease, have had more than one sex partner in the past 6 months, may be exposed to blood or other infectious body fluids, are household contacts or sex partners of hepatitis B positive people, are clients or workers in certain care facilities, or travel to or work in countries with a high rate of hepatitis B.  Haemophilus influenzae type b (Hib) vaccine. A  previously unvaccinated person with asplenia or sickle cell disease or having a scheduled splenectomy should receive 1 dose of Hib vaccine. Regardless of previous immunization, a recipient of a hematopoietic stem cell transplant should receive a 3-dose series 6-12 months after his successful transplant. Hib vaccine is not recommended for adults with HIV infection. Preventive Service / Frequency Ages 19 to 39  Blood pressure check.** / Every 3-5 years.  Lipid and cholesterol check.** / Every 5 years beginning at age 20.  Hepatitis C blood test.** / For any individual with known risks for hepatitis C.  Skin self-exam. / Monthly.  Influenza vaccine. / Every year.  Tetanus, diphtheria, and acellular pertussis (Tdap, Td) vaccine.** / Consult your health care provider. 1 dose of Td every 10 years.  Varicella vaccine.** / Consult your health care provider.  HPV vaccine. / 3 doses over 6 months, if 26 or younger.  Measles, mumps, rubella (MMR) vaccine.** / You need at least 1 dose of MMR if you were born in 1957 or later. You may also need a second dose.  Pneumococcal 13-valent conjugate (PCV13) vaccine.** / Consult your health care provider.  Pneumococcal polysaccharide (PPSV23) vaccine.** / 1 to 2 doses if you smoke cigarettes or if you have certain conditions.  Meningococcal vaccine.** / 1 dose if you are age 19 to 21 years and a first-year college student living in a residence hall, or have one of several medical conditions. You may also need additional booster doses.  Hepatitis A vaccine.** / Consult your health care provider.  Hepatitis B vaccine.** / Consult your health care provider.  Haemophilus influenzae type b (Hib) vaccine.** / Consult your health care provider. Ages 40 to 64  Blood pressure check.** / Every year.  Lipid and cholesterol check.** / Every 5 years beginning at age 20.  Lung cancer screening. / Every year if you are aged 55-80 years and have a 30-pack-year  history of smoking and currently smoke or have quit within the past 15 years. Yearly screening is stopped once you have quit smoking   for at least 15 years or develop a health problem that would prevent you from having lung cancer treatment.  Fecal occult blood test (FOBT) of stool. / Every year beginning at age 69 and continuing until age 15. You may not have to do this test if you get a colonoscopy every 10 years.  Flexible sigmoidoscopy** or colonoscopy.** / Every 5 years for a flexible sigmoidoscopy or every 10 years for a colonoscopy beginning at age 60 and continuing until age 15.  Hepatitis C blood test.** / For all people born from [birth year] through 1965 and any individual with known risks for hepatitis C.  Skin self-exam. / Monthly.  Influenza vaccine. / Every year.  Tetanus, diphtheria, and acellular pertussis (Tdap/Td) vaccine.** / Consult your health care provider. 1 dose of Td every 10 years.  Varicella vaccine.** / Consult your health care provider.  Zoster vaccine.** / 1 dose for adults aged 31 years or older.  Measles, mumps, rubella (MMR) vaccine.** / You need at least 1 dose of MMR if you were born in 1957 or later. You may also need a second dose.  Pneumococcal 13-valent conjugate (PCV13) vaccine.** / Consult your health care provider.  Pneumococcal polysaccharide (PPSV23) vaccine.** / 1 to 2 doses if you smoke cigarettes or if you have certain conditions.  Meningococcal vaccine.** / Consult your health care provider.  Hepatitis A vaccine.** / Consult your health care provider.  Hepatitis B vaccine.** / Consult your health care provider.  Haemophilus influenzae type b (Hib) vaccine.** / Consult your health care provider. Ages 73 and over  Blood pressure check.** / Every year.  Lipid and cholesterol check.**/ Every 5 years beginning at age 59.  Lung cancer screening. / Every year if you are aged 34-80 years and have a 30-pack-year history of smoking and currently  smoke or have quit within the past 15 years. Yearly screening is stopped once you have quit smoking for at least 15 years or develop a health problem that would prevent you from having lung cancer treatment.  Fecal occult blood test (FOBT) of stool. / Every year beginning at age 65 and continuing until age 67. You may not have to do this test if you get a colonoscopy every 10 years.  Flexible sigmoidoscopy** or colonoscopy.** / Every 5 years for a flexible sigmoidoscopy or every 10 years for a colonoscopy beginning at age 65 and continuing until age 2.  Hepatitis C blood test.** / For all people born from [birth year] through 1965 and any individual with known risks for hepatitis C.  Abdominal aortic aneurysm (AAA) screening.** / A one-time screening for ages 62 to 54 years who are current or former smokers.  Skin self-exam. / Monthly.  Influenza vaccine. / Every year.  Tetanus, diphtheria, and acellular pertussis (Tdap/Td) vaccine.** / 1 dose of Td every 10 years.  Varicella vaccine.** / Consult your health care provider.  Zoster vaccine.** / 1 dose for adults aged 55 years or older.  Pneumococcal 13-valent conjugate (PCV13) vaccine.** / 1 dose for all adults aged 46 years and older.  Pneumococcal polysaccharide (PPSV23) vaccine.** / 1 dose for all adults aged 54 years and older.  Meningococcal vaccine.** / Consult your health care provider.  Hepatitis A vaccine.** / Consult your health care provider.  Hepatitis B vaccine.** / Consult your health care provider.  Haemophilus influenzae type b (Hib) vaccine.** / Consult your health care provider. **Family history and personal history of risk and conditions may change your health care provider's recommendations.  This information is not intended to replace advice given to you by your health care provider. Make sure you discuss any questions you have with your health care provider.   Document Released: 09/29/2001 Document Revised:  08/24/2014 Document Reviewed: 12/29/2010 [employer] Interactive Patient Education [employer].

## 2015-11-08 NOTE — Progress Notes (Signed)
Patient presents to clinic today for annual exam.  Patient is fasting for labs.  Chronic Issues: GERD -- Is taking medications as directed. Followed by GI. Endorses doing very well. Is watching his diet.   Health Maintenance: Immunizations -- Is up-to-date on Tetanus. Declines flu shot. Colonoscopy --Is scheduled for July 2017 for screening colonoscopy.   Past Medical History  Diagnosis Date  . Asthma   . Vitamin D deficiency   . Anxiety   . Schatzki's ring   . Sliding hiatal hernia   . GERD (gastroesophageal reflux disease)     Past Surgical History  Procedure Laterality Date  . Wisdom tooth extraction    . Tooth extraction      Current Outpatient Prescriptions on File Prior to Visit  Medication Sig Dispense Refill  . ADVAIR DISKUS 100-50 MCG/DOSE AEPB INHALE 1 PUFF BY MOUTH TWICE DAILY. RINSE MOUTH AND SPIT AFTER USE 60 each 5  . albuterol (VENTOLIN HFA) 108 (90 BASE) MCG/ACT inhaler Inhale 1-2 puffs into the lungs every 4 (four) hours as needed for wheezing or shortness of breath (MAXIMUM OF 12 PUFFS PER DAY). 18 g 2  . Dexlansoprazole 30 MG capsule Take 1 capsule (30 mg total) by mouth daily. 30 capsule 3  . diltiazem (CARDIZEM) 30 MG tablet Take 1 tablet (30 mg total) by mouth 3 (three) times daily as needed. 90 tablet 1  . fexofenadine (ALLEGRA) 180 MG tablet Take 180 mg by mouth daily as needed for allergies or rhinitis. Reported on 08/23/2015    . Probiotic Product (PROBIOTIC DAILY) CAPS Take by mouth daily. Reported on 08/23/2015     No current facility-administered medications on file prior to visit.    Allergies  Allergen Reactions  . Dust Mite Extract   . Other     Grass, Mold, Mildew  . Shellfish Allergy   . Wheat Bran Other (See Comments)    All Wheat Products  . Eggs Or Egg-Derived Products Other (See Comments)    Sensitivity    Family History  Problem Relation Age of Onset  . Diabetes Mother   . Hypertension Father   . Heart disease Father   .  Diabetes Brother   . Diabetes Maternal Grandmother   . Cancer Paternal Grandfather     Brain Tumor  . Colon cancer Neg Hx     Social History   Social History  . Marital Status: Legally Separated    Spouse Name: N/A  . Number of Children: N/A  . Years of Education: N/A   Occupational History  . Not on file.   Social History Main Topics  . Smoking status: Never Smoker   . Smokeless tobacco: Never Used  . Alcohol Use: No  . Drug Use: No  . Sexual Activity: No   Other Topics Concern  . Not on file   Social History Narrative    Review of Systems  Constitutional: Negative for fever and weight loss.  HENT: Negative for ear discharge, ear pain, hearing loss and tinnitus.   Eyes: Negative for blurred vision, double vision, photophobia and pain.  Respiratory: Negative for cough and shortness of breath.   Cardiovascular: Negative for chest pain and palpitations.  Gastrointestinal: Negative for heartburn, nausea, vomiting, abdominal pain, diarrhea, constipation, blood in stool and melena.  Genitourinary: Negative for dysuria, urgency, frequency, hematuria and flank pain.  Musculoskeletal: Negative for falls.  Neurological: Negative for dizziness, loss of consciousness and headaches.  Endo/Heme/Allergies: Negative for environmental allergies.  Psychiatric/Behavioral: Negative  for depression, suicidal ideas, hallucinations and substance abuse. The patient is not nervous/anxious and does not have insomnia.     BP 120/78 mmHg  Pulse 71  Temp(Src) 98 F (36.7 C) (Oral)  Ht 6' 1"  (1.854 m)  Wt 232 lb 12.8 oz (105.597 kg)  BMI 30.72 kg/m2  SpO2 97%  Physical Exam  Constitutional: He is oriented to person, place, and time and well-developed, well-nourished, and in no distress.  HENT:  Head: Normocephalic and atraumatic.  Right Ear: External ear normal.  Left Ear: External ear normal.  Nose: Nose normal.  Mouth/Throat: Oropharynx is clear and moist. No oropharyngeal exudate.    Eyes: Conjunctivae and EOM are normal. Pupils are equal, round, and reactive to light.  Neck: Neck supple. No thyromegaly present.  Cardiovascular: Normal rate, regular rhythm, normal heart sounds and intact distal pulses.   Pulmonary/Chest: Effort normal and breath sounds normal. No respiratory distress. He has no wheezes. He has no rales. He exhibits no tenderness.  Abdominal: Soft. Bowel sounds are normal. He exhibits no distension and no mass. There is no tenderness. There is no rebound and no guarding.  Genitourinary: Testes/scrotum normal.  Patient defers DRE  Lymphadenopathy:    He has no cervical adenopathy.  Neurological: He is alert and oriented to person, place, and time.  Skin: Skin is warm and dry. No rash noted.  Psychiatric: Affect normal.  Vitals reviewed.   Recent Results (from the past 2160 hour(s))  Exercise Tolerance Test     Status: None   Collection Time: 10/31/15 10:46 AM  Result Value Ref Range   Rest HR 77 bpm   Rest BP 117/97 mmHg   Exercise duration (min) 10 min   Exercise duration (sec) 0 sec   Estimated workload 11.6 METS   Peak HR 153 BPM   Peak BP  mmHg   MPHR 170 bpm   Percent HR 91 %   RPE 17    Phase 1 name PRETEST    Stage 1 Name SITTING    Stage 1 Time 00:01:27    Stage 1 Speed 0.0 mph   Stage 1 Grade 0.0 %   Stage 1 HR 90 bpm   Stage 1 SBP 117 mmHg   Stage 1 DBP 97 mmHg   Phase 2 Name PRETEST    Stage 2 Name WARM-UP    Stage 2 Time 00:00:10    Stage 2 Speed 1.0 mph   Stage 2 Grade 0.0 %   Stage 2 HR 90 bpm   Phase 3 Name Exercise    Stage 3 Name STAGE 1    Stage 3 Attribute Baseline    Stage 3 Time 00:00:01    Stage 3 Speed 1.0 mph   Stage 3 Grade 0.0 %   Stage 3 HR 87 bpm   Phase 4 Name Exercise    Stage 4 Name STAGE 1    Stage 4 Time 00:03:00    Stage 4 Speed 1.7 mph   Stage 4 Grade 10.0 %   Stage 4 HR 111 bpm   Phase 5 Name Exercise    Stage 5 Name STAGE 2    Stage 5 Time 00:03:00    Stage 5 Speed 2.5 mph   Stage 5  Grade 12.0 %   Stage 5 HR 136 bpm   Stage 5 SBP 172 mmHg   Stage 5 DBP 78 mmHg   Phase 6 Name Exercise    Stage 6 Name STAGE 3    Stage  6 Time 00:03:00    Stage 6 Speed 3.4 mph   Stage 6 Grade 14.0 %   Stage 6 HR 148 bpm   Stage 6 SBP 182 mmHg   Stage 6 DBP 82 mmHg   Phase 7 Name Exercise    Stage 7 Name STAGE 4    Stage 7 Attribute Peak    Stage 7 Time 00:01:01    Stage 7 Speed 4.1 mph   Stage 7 Grade 16.0 %   Stage 7 HR 153 bpm   Phase 8 Name Recovery    Stage 8 Attribute Recovery 23mn    Stage 8 Time 00:01:00    Stage 8 Speed 0.2 mph   Stage 8 Grade 0.0 %   Stage 8 HR 139 bpm   Stage 8 SBP 165 mmHg   Stage 8 DBP 83 mmHg   Phase 9 Name Recovery    Stage 9 Time 00:05:20    Stage 9 Speed 0.0 mph   Stage 9 Grade 0.0 %   Stage 9 HR 88 bpm   Stage 9 SBP 137 mmHg   Stage 9 DBP 87 mmHg   Percent of predicted max HR 90 %  CBC     Status: None   Collection Time: 11/08/15  3:48 PM  Result Value Ref Range   WBC 5.1 4.0 - 10.5 K/uL   RBC 4.75 4.22 - 5.81 MIL/uL   Hemoglobin 15.3 13.0 - 17.0 g/dL   HCT 44.6 39.0 - 52.0 %   MCV 93.9 78.0 - 100.0 fL   MCH 32.2 26.0 - 34.0 pg   MCHC 34.3 30.0 - 36.0 g/dL   RDW 13.0 11.5 - 15.5 %   Platelets 188 150 - 400 K/uL   MPV 10.2 8.6 - 12.4 fL  Comp Met (CMET)     Status: None   Collection Time: 11/08/15  3:48 PM  Result Value Ref Range   Sodium 143 135 - 146 mmol/L   Potassium 4.0 3.5 - 5.3 mmol/L   Chloride 100 98 - 110 mmol/L   CO2 27 20 - 31 mmol/L   Glucose, Bld 84 65 - 99 mg/dL   BUN 13 7 - 25 mg/dL   Creat 1.14 0.70 - 1.33 mg/dL   Total Bilirubin 0.7 0.2 - 1.2 mg/dL   Alkaline Phosphatase 55 40 - 115 U/L   AST 22 10 - 35 U/L   ALT 20 9 - 46 U/L   Total Protein 6.7 6.1 - 8.1 g/dL   Albumin 4.2 3.6 - 5.1 g/dL   Calcium 8.7 8.6 - 10.3 mg/dL  TSH     Status: None   Collection Time: 11/08/15  3:48 PM  Result Value Ref Range   TSH 2.01 0.40 - 4.50 mIU/L  Hemoglobin A1C     Status: None   Collection Time: 11/08/15  3:48  PM  Result Value Ref Range   Hgb A1c MFr Bld 5.5 <5.7 %    Comment:                                                                        According to the ADA Clinical Practice Recommendations for 2011, when HbA1c is used as a screening test:     >=  6.5%   Diagnostic of Diabetes Mellitus            (if abnormal result is confirmed)   5.7-6.4%   Increased risk of developing Diabetes Mellitus   References:Diagnosis and Classification of Diabetes Mellitus,Diabetes SHFW,2637,85(YIFOY 1):S62-S69 and Standards of Medical Care in         Diabetes - 2011,Diabetes Care,2011,34 (Suppl 1):S11-S61.      Mean Plasma Glucose 111 <117 mg/dL  Urinalysis, Routine w reflex microscopic (not at [facility])     Status: None   Collection Time: 11/08/15  3:48 PM  Result Value Ref Range   Color, Urine YELLOW YELLOW    Comment: ** Please note change in unit of measure and reference range(s). **      APPearance CLEAR CLEAR   Specific Gravity, Urine 1.028 1.001 - 1.035   pH 5.5 5.0 - 8.0   Glucose, UA NEGATIVE NEGATIVE   Bilirubin Urine NEGATIVE NEGATIVE   Ketones, ur NEGATIVE NEGATIVE   Hgb urine dipstick NEGATIVE NEGATIVE   Protein, ur NEGATIVE NEGATIVE   Nitrite NEGATIVE NEGATIVE   Leukocytes, UA NEGATIVE NEGATIVE  Lipid panel     Status: Abnormal   Collection Time: 11/08/15  3:48 PM  Result Value Ref Range   Cholesterol 149 125 - 200 mg/dL   Triglycerides 214 (H) <150 mg/dL   HDL 30 (L) >=40 mg/dL   Total CHOL/HDL Ratio 5.0 <=5.0 Ratio   VLDL 43 (H) <30 mg/dL   LDL Cholesterol 76 <130 mg/dL    Comment:   Total Cholesterol/HDL Ratio:CHD Risk                        Coronary Heart Disease Risk Table                                        Men       Women          1/2 Average Risk              3.4        3.3              Average Risk              5.0        4.4           2X Average Risk              9.6        7.1           3X Average Risk             23.4       11.0 Use the calculated Patient  Ratio above and the CHD Risk table  to determine the patient's CHD Risk.   PSA     Status: None   Collection Time: 11/08/15  3:48 PM  Result Value Ref Range   PSA 1.11 <=4.00 ng/mL    Comment: Test Methodology: ECLIA PSA (Electrochemiluminescence Immunoassay)   For PSA values from 2.5-4.0, particularly in younger men <62 years old, the AUA and NCCN suggest testing for % Free PSA (3515) and evaluation of the rate of increase in PSA (PSA velocity).     Assessment/Plan: Visit for preventive health examination Depression screen negative. Health Maintenance reviewed -- declines flu shot. Tetanus up-to-date. Is scheduled this summer  for a screening colonoscopy.  Preventive schedule discussed and handout given in AVS. Will obtain fasting labs today.   Prostate cancer screening Risks, benefits and screening methodologies reviewed with patient. Patient defers DRE and wishes to proceed with screening PSA. PSA ordered.  Gastroesophageal reflux disease without esophagitis Doing well. Continue current regimen. Follow-up with GI as scheduled.

## 2015-11-09 LAB — LIPID PANEL
CHOL/HDL RATIO: 5 ratio (ref <=5.0)
CHOLESTEROL: 149 mg/dL (ref 125–200)
HDL: 30 mg/dL — AB (ref >=40)
LDL Cholesterol: 76 mg/dL (ref <130)
TRIGLYCERIDES: 214 mg/dL — AB (ref <150)
VLDL: 43 mg/dL — ABNORMAL HIGH (ref <30)

## 2015-11-09 LAB — COMPREHENSIVE METABOLIC PANEL
ALBUMIN: 4.2 g/dL (ref 3.6–5.1)
ALT: 20 U/L (ref 9–46)
AST: 22 U/L (ref 10–35)
Alkaline Phosphatase: 55 U/L (ref 40–115)
BUN: 13 mg/dL (ref 7–25)
CO2: 27 mmol/L (ref 20–31)
CREATININE: 1.14 mg/dL (ref 0.70–1.33)
Calcium: 8.7 mg/dL (ref 8.6–10.3)
Chloride: 100 mmol/L (ref 98–110)
Glucose, Bld: 84 mg/dL (ref 65–99)
POTASSIUM: 4 mmol/L (ref 3.5–5.3)
SODIUM: 143 mmol/L (ref 135–146)
TOTAL PROTEIN: 6.7 g/dL (ref 6.1–8.1)
Total Bilirubin: 0.7 mg/dL (ref 0.2–1.2)

## 2015-11-09 LAB — URINALYSIS, ROUTINE W REFLEX MICROSCOPIC
BILIRUBIN URINE: NEGATIVE
Glucose, UA: NEGATIVE
HGB URINE DIPSTICK: NEGATIVE
Ketones, ur: NEGATIVE
LEUKOCYTES UA: NEGATIVE
NITRITE: NEGATIVE
PH: 5.5 (ref 5.0–8.0)
PROTEIN: NEGATIVE
SPECIFIC GRAVITY, URINE: 1.028 (ref 1.001–1.035)

## 2015-11-09 LAB — PSA: PSA: 1.11 ng/mL (ref <=4.00)

## 2015-11-10 DIAGNOSIS — Z125 Encounter for screening for malignant neoplasm of prostate: Secondary | ICD-10-CM | POA: Insufficient documentation

## 2015-11-10 DIAGNOSIS — Z0000 Encounter for general adult medical examination without abnormal findings: Secondary | ICD-10-CM | POA: Insufficient documentation

## 2015-11-10 NOTE — Assessment & Plan Note (Signed)
Doing well. Continue current regimen. Follow-up with GI as scheduled.

## 2015-11-10 NOTE — Assessment & Plan Note (Signed)
Depression screen negative. Health Maintenance reviewed -- declines flu shot. Tetanus up-to-date. Is scheduled this summer for a screening colonoscopy.  Preventive schedule discussed and handout given in AVS. Will obtain fasting labs today.

## 2015-11-10 NOTE — Assessment & Plan Note (Signed)
Risks, benefits and screening methodologies reviewed with patient. Patient defers DRE and wishes to proceed with screening PSA. PSA ordered.

## 2015-11-11 ENCOUNTER — Other Ambulatory Visit: Payer: Self-pay | Admitting: Physician Assistant

## 2015-11-11 DIAGNOSIS — E781 Pure hyperglyceridemia: Secondary | ICD-10-CM

## 2015-11-13 ENCOUNTER — Telehealth: Payer: Self-pay | Admitting: *Deleted

## 2015-11-13 NOTE — Telephone Encounter (Signed)
[facility] @ 11:46am @ [phone]) informing the pt that the Health Provider Screening Form is completed and ready to be picked up.  Informed the pt that I will leave the form up front.//AB/CMA

## 2015-11-16 ENCOUNTER — Other Ambulatory Visit: Payer: Self-pay | Admitting: Physician Assistant

## 2015-11-20 ENCOUNTER — Ambulatory Visit (INDEPENDENT_AMBULATORY_CARE_PROVIDER_SITE_OTHER): Payer: 59 | Admitting: Psychology

## 2015-11-20 DIAGNOSIS — F4323 Adjustment disorder with mixed anxiety and depressed mood: Secondary | ICD-10-CM

## 2015-11-27 ENCOUNTER — Other Ambulatory Visit: Payer: Self-pay | Admitting: Internal Medicine

## 2015-12-04 ENCOUNTER — Ambulatory Visit (INDEPENDENT_AMBULATORY_CARE_PROVIDER_SITE_OTHER): Payer: 59 | Admitting: Psychology

## 2015-12-04 DIAGNOSIS — F4323 Adjustment disorder with mixed anxiety and depressed mood: Secondary | ICD-10-CM | POA: Diagnosis not present

## 2015-12-16 ENCOUNTER — Other Ambulatory Visit: Payer: 59

## 2015-12-16 ENCOUNTER — Encounter: Payer: Self-pay | Admitting: Internal Medicine

## 2015-12-18 ENCOUNTER — Ambulatory Visit (INDEPENDENT_AMBULATORY_CARE_PROVIDER_SITE_OTHER): Payer: 59 | Admitting: Psychology

## 2015-12-18 DIAGNOSIS — F4323 Adjustment disorder with mixed anxiety and depressed mood: Secondary | ICD-10-CM

## 2015-12-23 ENCOUNTER — Other Ambulatory Visit: Payer: Self-pay | Admitting: Physician Assistant

## 2015-12-28 ENCOUNTER — Other Ambulatory Visit: Payer: Self-pay | Admitting: Internal Medicine

## 2016-01-01 ENCOUNTER — Ambulatory Visit (INDEPENDENT_AMBULATORY_CARE_PROVIDER_SITE_OTHER): Payer: 59 | Admitting: Psychology

## 2016-01-01 DIAGNOSIS — F4323 Adjustment disorder with mixed anxiety and depressed mood: Secondary | ICD-10-CM

## 2016-01-15 ENCOUNTER — Ambulatory Visit (INDEPENDENT_AMBULATORY_CARE_PROVIDER_SITE_OTHER): Payer: 59 | Admitting: Psychology

## 2016-01-15 DIAGNOSIS — F4323 Adjustment disorder with mixed anxiety and depressed mood: Secondary | ICD-10-CM

## 2016-01-17 NOTE — Telephone Encounter (Signed)
Pre Visit call 

## 2016-01-28 ENCOUNTER — Other Ambulatory Visit: Payer: Self-pay | Admitting: Internal Medicine

## 2016-01-29 ENCOUNTER — Ambulatory Visit (INDEPENDENT_AMBULATORY_CARE_PROVIDER_SITE_OTHER): Payer: 59 | Admitting: Psychology

## 2016-01-29 DIAGNOSIS — F4323 Adjustment disorder with mixed anxiety and depressed mood: Secondary | ICD-10-CM

## 2016-01-31 ENCOUNTER — Telehealth: Payer: Self-pay | Admitting: Physician Assistant

## 2016-01-31 NOTE — Telephone Encounter (Signed)
Pt had [employer] form completed since 11-11-15 (Provider Screening Form), pt never pick up document - Document was mail to pt address.

## 2016-02-12 ENCOUNTER — Ambulatory Visit (INDEPENDENT_AMBULATORY_CARE_PROVIDER_SITE_OTHER): Payer: 59 | Admitting: Psychology

## 2016-02-12 DIAGNOSIS — F4323 Adjustment disorder with mixed anxiety and depressed mood: Secondary | ICD-10-CM | POA: Diagnosis not present

## 2016-02-26 ENCOUNTER — Ambulatory Visit (INDEPENDENT_AMBULATORY_CARE_PROVIDER_SITE_OTHER): Payer: 59 | Admitting: Psychology

## 2016-02-26 DIAGNOSIS — F4322 Adjustment disorder with anxiety: Secondary | ICD-10-CM

## 2016-03-11 ENCOUNTER — Ambulatory Visit (INDEPENDENT_AMBULATORY_CARE_PROVIDER_SITE_OTHER): Payer: 59 | Admitting: Psychology

## 2016-03-11 DIAGNOSIS — F4322 Adjustment disorder with anxiety: Secondary | ICD-10-CM | POA: Diagnosis not present

## 2016-03-18 ENCOUNTER — Encounter: Payer: Self-pay | Admitting: Physician Assistant

## 2016-03-25 ENCOUNTER — Ambulatory Visit (INDEPENDENT_AMBULATORY_CARE_PROVIDER_SITE_OTHER): Payer: 59 | Admitting: Psychology

## 2016-03-25 DIAGNOSIS — F4322 Adjustment disorder with anxiety: Secondary | ICD-10-CM | POA: Diagnosis not present

## 2016-04-08 ENCOUNTER — Ambulatory Visit (INDEPENDENT_AMBULATORY_CARE_PROVIDER_SITE_OTHER): Payer: 59 | Admitting: Psychology

## 2016-04-08 DIAGNOSIS — F4322 Adjustment disorder with anxiety: Secondary | ICD-10-CM | POA: Diagnosis not present

## 2016-04-13 ENCOUNTER — Other Ambulatory Visit: Payer: Self-pay | Admitting: Physician Assistant

## 2016-04-22 ENCOUNTER — Ambulatory Visit (INDEPENDENT_AMBULATORY_CARE_PROVIDER_SITE_OTHER): Payer: 59 | Admitting: Psychology

## 2016-04-22 DIAGNOSIS — F4322 Adjustment disorder with anxiety: Secondary | ICD-10-CM

## 2016-05-06 ENCOUNTER — Ambulatory Visit (INDEPENDENT_AMBULATORY_CARE_PROVIDER_SITE_OTHER): Payer: 59 | Admitting: Psychology

## 2016-05-06 DIAGNOSIS — F4322 Adjustment disorder with anxiety: Secondary | ICD-10-CM

## 2016-05-20 ENCOUNTER — Ambulatory Visit (INDEPENDENT_AMBULATORY_CARE_PROVIDER_SITE_OTHER): Payer: 59 | Admitting: Psychology

## 2016-05-20 DIAGNOSIS — F4322 Adjustment disorder with anxiety: Secondary | ICD-10-CM

## 2016-05-21 ENCOUNTER — Other Ambulatory Visit: Payer: Self-pay | Admitting: Physician Assistant

## 2016-05-21 NOTE — Telephone Encounter (Signed)
Medication filled to pharmacy as requested.   

## 2016-05-29 ENCOUNTER — Other Ambulatory Visit: Payer: Self-pay | Admitting: Physician Assistant

## 2016-06-02 ENCOUNTER — Telehealth: Payer: 59 | Admitting: Family

## 2016-06-02 DIAGNOSIS — H9203 Otalgia, bilateral: Secondary | ICD-10-CM

## 2016-06-02 NOTE — Progress Notes (Signed)
Based on what you shared with me it looks like you have a serious condition that should be evaluated in a face to face office visit.  If you are having a true medical emergency please call 911.  If you need an urgent face to face visit, [facility] has four urgent care centers for your convenience.  If you need care fast and have a high deductible or no insurance consider:   [email or web address]  [phone]  [address], [address] [place], [place] [address] 8 am to 8 pm Monday-Friday 10 am to 4 pm Saturday-Sunday   The following sites will take your  insurance:    . [facility]  [phone] Get Driving Directions Find a Provider at this Location  [address] [place], [place] [address] . 10 am to 8 pm Monday-Friday . 12 pm to 8 pm Saturday-Sunday   . [facility]  [phone] Get Driving Directions Find a Provider at this Location  [address], [address] , [place] [address] . 8 am to 8 pm Monday-Friday . 9 am to 6 pm Saturday . 11 am to 6 pm Sunday   . [facility] [place]  [phone] Get Driving Directions  [address] [place], [place] [address] . 8 am to 8 pm Monday-Friday . 8 am to 4 pm Saturday-Sunday   . [facility] (walk-ins welcome, or call for a scheduled time)  [phone]  Get Driving Directions Find a Provider at this Location  [address] ,  [address] . 8 am to 8:30 pm Monday-Thursday . 8 am to 6 pm Friday . 8 am to 4 pm Saturday-Sunday   Your e-visit answers were reviewed by a board certified advanced clinical practitioner to complete your personal care plan.  Thank you for using e-Visits.  

## 2016-06-03 ENCOUNTER — Encounter: Payer: Self-pay | Admitting: Physician Assistant

## 2016-06-03 ENCOUNTER — Ambulatory Visit (INDEPENDENT_AMBULATORY_CARE_PROVIDER_SITE_OTHER): Payer: 59 | Admitting: Physician Assistant

## 2016-06-03 ENCOUNTER — Ambulatory Visit (INDEPENDENT_AMBULATORY_CARE_PROVIDER_SITE_OTHER): Payer: 59 | Admitting: Psychology

## 2016-06-03 VITALS — BP 127/75 | HR 58 | Temp 98.0°F | Resp 16 | Ht 73.0 in | Wt 224.1 lb

## 2016-06-03 DIAGNOSIS — H93A3 Pulsatile tinnitus, bilateral: Secondary | ICD-10-CM | POA: Diagnosis not present

## 2016-06-03 DIAGNOSIS — F4322 Adjustment disorder with anxiety: Secondary | ICD-10-CM | POA: Diagnosis not present

## 2016-06-03 NOTE — Patient Instructions (Signed)
I am sending you to ENT for an urgent evaluation giving the pulsing characteristic of your symptoms are normal examination.   You will be contacted to schedule.  If you do not hear from the by tomorrow noontime, call me.  Download a white noise app on your machine to help block out sound while resting. Stay hydrated and limit salt intake.

## 2016-06-03 NOTE — Progress Notes (Signed)
Patient presents to clinic today c/o 2.5 months of ringing in ears bilaterally described as a throbbing, heart beat sound. Endorses worse in L ear compared to R ear. Notes sound is constant in the left ear, intermittent in the R. Denies headaches, nausea, vomiting, fever or chills. Denies decrease in hearing or drainage from ear. Denies otalgia. Denies sinus pressure, pain, nasal congestion. Denies lightheadedness or dizziness. Patient denies palpitation, chest pain or SOB. Recent low risk stress test.  Patient cannot recall any loud noise exposure.   Past Medical History:  Diagnosis Date  . Anxiety   . Asthma   . GERD (gastroesophageal reflux disease)   . Schatzki's ring   . Sliding hiatal hernia   . Vitamin D deficiency     Current Outpatient Prescriptions on File Prior to Visit  Medication Sig Dispense Refill  . ADVAIR DISKUS 100-50 MCG/DOSE AEPB INHALE 1 PUFF BY MOUTH TWICE DAILY. RINSE MOUTH AND SPIT AFTER USE 1 each 0  . albuterol (VENTOLIN HFA) 108 (90 BASE) MCG/ACT inhaler Inhale 1-2 puffs into the lungs every 4 (four) hours as needed for wheezing or shortness of breath (MAXIMUM OF 12 PUFFS PER DAY). 18 g 2  . DEXILANT 30 MG capsule TAKE 1 CAPSULE(30 MG) BY MOUTH DAILY 30 capsule 2  . diltiazem (CARDIZEM) 30 MG tablet TAKE 1 TABLET(30 MG) BY MOUTH THREE TIMES DAILY AS NEEDED 90 tablet 0  . fexofenadine (ALLEGRA) 180 MG tablet Take 180 mg by mouth daily as needed for allergies or rhinitis. Reported on 08/23/2015    . fluticasone (FLONASE) 50 MCG/ACT nasal spray USE 2 SPRAYS IN EACH NOSTRIL DAILY AS NEEDED 16 g 5  . Probiotic Product (PROBIOTIC DAILY) CAPS Take by mouth daily. Reported on 08/23/2015     No current facility-administered medications on file prior to visit.     Allergies  Allergen Reactions  . Dust Mite Extract   . Other     Grass, Mold, Mildew  . Shellfish Allergy   . Wheat Bran Other (See Comments)    All Wheat Products  . Eggs Or Egg-Derived Products Other (See  Comments)    Sensitivity    Family History  Problem Relation Age of Onset  . Diabetes Mother   . Hypertension Father   . Heart disease Father   . Diabetes Brother   . Diabetes Maternal Grandmother   . Cancer Paternal Grandfather     Brain Tumor  . Colon cancer Neg Hx     Social History   Social History  . Marital status: Legally Separated    Spouse name: N/A  . Number of children: N/A  . Years of education: N/A   Social History Main Topics  . Smoking status: Never Smoker  . Smokeless tobacco: Never Used  . Alcohol use No  . Drug use: No  . Sexual activity: No   Other Topics Concern  . None   Social History Narrative  . None    Review of Systems - See HPI.  All other ROS are negative.  BP 127/75 (BP Location: Left Arm, Patient Position: Sitting, Cuff Size: Large)   Pulse (!) 58   Temp 98 F (36.7 C) (Oral)   Resp 16   Ht 6\' 1"  (1.854 m)   Wt 224 lb 2 oz (101.7 kg)   SpO2 97%   BMI 29.57 kg/m   Physical Exam  Constitutional: He is oriented to person, place, and time and well-developed, well-nourished, and in no distress.  HENT:  Head: Normocephalic and atraumatic.  Right Ear: Tympanic membrane, external ear and ear canal normal.  Left Ear: Tympanic membrane, external ear and ear canal normal.  Nose: Nose normal.  Mouth/Throat: Oropharynx is clear and moist. No oropharyngeal exudate.  Eyes: Conjunctivae are normal. Pupils are equal, round, and reactive to light.  Neck: Neck supple. Normal carotid pulses present. Carotid bruit is not present. No thyromegaly present.  Temporal Arteries palpated without swelling or tenderness.  Cardiovascular: Normal rate, regular rhythm, normal heart sounds and intact distal pulses.   Pulmonary/Chest: Effort normal and breath sounds normal.  Neurological: He is alert and oriented to person, place, and time. No cranial nerve deficit.  Skin: Skin is warm and dry. No rash noted.  Vitals reviewed.  Assessment/Plan: 1.  Pulsatile tinnitus of both ears Exam unremarkable. Pulsatile nature of tinnitus is concerning. Urgent referral placed to ENT for further assessment and management.  - Ambulatory referral to ENT   Piedad ClimesMartin, Marland Reine Cody, PA-C

## 2016-06-17 ENCOUNTER — Ambulatory Visit (INDEPENDENT_AMBULATORY_CARE_PROVIDER_SITE_OTHER): Payer: 59 | Admitting: Psychology

## 2016-06-17 DIAGNOSIS — F4322 Adjustment disorder with anxiety: Secondary | ICD-10-CM

## 2016-07-01 ENCOUNTER — Ambulatory Visit (INDEPENDENT_AMBULATORY_CARE_PROVIDER_SITE_OTHER): Payer: 59 | Admitting: Psychology

## 2016-07-01 DIAGNOSIS — F4322 Adjustment disorder with anxiety: Secondary | ICD-10-CM

## 2016-07-05 ENCOUNTER — Other Ambulatory Visit: Payer: Self-pay | Admitting: Physician Assistant

## 2016-07-15 ENCOUNTER — Ambulatory Visit (INDEPENDENT_AMBULATORY_CARE_PROVIDER_SITE_OTHER): Payer: 59 | Admitting: Psychology

## 2016-07-15 DIAGNOSIS — F4322 Adjustment disorder with anxiety: Secondary | ICD-10-CM | POA: Diagnosis not present

## 2016-07-22 ENCOUNTER — Telehealth: Payer: 59 | Admitting: Family

## 2016-07-22 DIAGNOSIS — R6889 Other general symptoms and signs: Secondary | ICD-10-CM

## 2016-07-22 NOTE — Progress Notes (Signed)
E visit for Flu like symptoms   We are sorry that you are not feeling well.  Here is how we plan to help! Based on what you have shared with me it looks like you may have a respiratory virus that may be influenza.  Influenza or "the flu" is   an infection caused by a respiratory virus. The flu virus is highly contagious and persons who did not receive their yearly flu vaccination may "catch" the flu from close contact.  We have anti-viral medications to treat the viruses that cause this infection. They are not a "cure" and only shorten the course of the infection. These prescriptions are most effective when they are given within the first 2 days of "flu" symptoms. Antiviral medication are indicated if you have a high risk of complications from the flu. You should  also consider an antiviral medication if you are in close contact with someone who is at risk. These medications can help patients avoid complications from the flu  but have side effects that you should know. Possible side effects from Tamiflu or oseltamivir include nausea, vomiting, diarrhea, dizziness, headaches, eye redness, sleep problems or other respiratory symptoms. You should not take Tamiflu if you have an allergy to oseltamivir or any to the ingredients in Tamiflu.  Based upon your symptoms and potential risk factors I recommend that you follow the flu symptoms recommendation that I have listed below.  ANYONE WHO HAS FLU SYMPTOMS SHOULD: . Stay home. The flu is highly contagious and going out or to work exposes others! . Be sure to drink plenty of fluids. Water is fine as well as fruit juices, sodas and electrolyte beverages. You may want to stay away from caffeine or alcohol. If you are nauseated, try taking small sips of liquids. How do you know if you are getting enough fluid? Your urine should be a pale yellow or almost colorless. . Get rest. . Taking a steamy shower or using a humidifier may help nasal congestion and ease  sore throat pain. Using a saline nasal spray works much the same way. . Cough drops, hard candies and sore throat lozenges may ease your cough. . Line up a caregiver. Have someone check on you regularly.   GET HELP RIGHT AWAY IF: . You cannot keep down liquids or your medications. . You become short of breath . Your fell like you are going to pass out or loose consciousness. . Your symptoms persist after you have completed your treatment plan MAKE SURE YOU   Understand these instructions.  Will watch your condition.  Will get help right away if you are not doing well or get worse.  Your e-visit answers were reviewed by a board certified advanced clinical practitioner to complete your personal care plan.  Depending on the condition, your plan could have included both over the counter or prescription medications.  If there is a problem please reply  once you have received a response from your provider.  Your safety is important to us.  If you have drug allergies check your prescription carefully.    You can use MyChart to ask questions about today's visit, request a non-urgent call back, or ask for a work or school excuse for 24 hours related to this e-Visit. If it has been greater than 24 hours you will need to follow up with your provider, or enter a new e-Visit to address those concerns.  You will get an e-mail in the next two days asking about   your experience.  I hope that your e-visit has been valuable and will speed your recovery. Thank you for using e-visits.   

## 2016-07-29 ENCOUNTER — Ambulatory Visit (INDEPENDENT_AMBULATORY_CARE_PROVIDER_SITE_OTHER): Payer: 59 | Admitting: Psychology

## 2016-07-29 DIAGNOSIS — F4322 Adjustment disorder with anxiety: Secondary | ICD-10-CM

## 2016-08-11 ENCOUNTER — Other Ambulatory Visit: Payer: Self-pay | Admitting: Physician Assistant

## 2016-08-13 ENCOUNTER — Telehealth: Payer: Self-pay | Admitting: Physician Assistant

## 2016-08-13 NOTE — Telephone Encounter (Signed)
Ok with me 

## 2016-08-13 NOTE — Telephone Encounter (Signed)
OK 

## 2016-08-13 NOTE — Telephone Encounter (Signed)
[facility] is [facility] to Dr. Carmelia RollerWendling due to [place] move [place]. Patient has appointment scheduled for 08/14/16. Please advise.

## 2016-08-14 ENCOUNTER — Ambulatory Visit (INDEPENDENT_AMBULATORY_CARE_PROVIDER_SITE_OTHER): Payer: 59 | Admitting: Family Medicine

## 2016-08-14 ENCOUNTER — Other Ambulatory Visit: Payer: Self-pay | Admitting: Family Medicine

## 2016-08-14 ENCOUNTER — Encounter: Payer: Self-pay | Admitting: Family Medicine

## 2016-08-14 VITALS — BP 128/82 | HR 70 | Temp 98.3°F | Ht 73.0 in | Wt 231.8 lb

## 2016-08-14 DIAGNOSIS — K224 Dyskinesia of esophagus: Secondary | ICD-10-CM

## 2016-08-14 DIAGNOSIS — K219 Gastro-esophageal reflux disease without esophagitis: Secondary | ICD-10-CM

## 2016-08-14 DIAGNOSIS — J4540 Moderate persistent asthma, uncomplicated: Secondary | ICD-10-CM

## 2016-08-14 DIAGNOSIS — J45909 Unspecified asthma, uncomplicated: Secondary | ICD-10-CM | POA: Diagnosis not present

## 2016-08-14 MED ORDER — EPINEPHRINE 0.15 MG/0.15ML IJ SOAJ: 0.1500 mg | INTRAMUSCULAR | 2 refills | Status: DC | PRN

## 2016-08-14 MED ORDER — DILTIAZEM HCL 30 MG PO TABS: ORAL_TABLET | ORAL | 1 refills | Status: DC

## 2016-08-14 MED ORDER — FLUTICASONE PROPIONATE 50 MCG/ACT NA SUSP: NASAL | 5 refills | Status: DC

## 2016-08-14 MED ORDER — DEXLANSOPRAZOLE 30 MG PO CPDR: DELAYED_RELEASE_CAPSULE | ORAL | 5 refills | Status: DC

## 2016-08-14 NOTE — Patient Instructions (Signed)
Try taking 2 puffs of albuterol 10 min before a run in the cold.

## 2016-08-14 NOTE — Progress Notes (Signed)
Chief Complaint  Patient presents with  . Asthma    Former pt of [employer] Morrison. Pt will need refill on Dexilant, Flonase, and Cardizem.     Subjective: Patient is a 50 y.o. male here for asthma f/u.  He is on Advair 100-50 mcg 1 puff twice daily.  He does not believe he has used in albuterol inhaler this year. He has not been hospitalized or required PO steroids.  No intubations. Triggers include allergens, possibly cold air  Hx of atypical chest pain. Was rx'd Cardizem for suspected esophageal spasm. Takes one when the pain comes on and it resolves. Needs refill.  ROS: Heart: Denies chest pain or palpitations Lungs: Denies SOB or cough GI: No weight loss or abdominal pain  Family History  Problem Relation Age of Onset  . Diabetes Mother   . Hypertension Father   . Heart disease Father   . Diabetes Brother   . Diabetes Maternal Grandmother   . Cancer Paternal Grandfather     Brain Tumor  . Colon cancer Neg Hx    Past Medical History:  Diagnosis Date  . Anxiety   . Asthma   . GERD (gastroesophageal reflux disease)   . Schatzki's ring   . Sliding hiatal hernia   . Vitamin D deficiency    Allergies  Allergen Reactions  . Dust Mite Extract   . Other     Grass, Mold, Mildew  . Shellfish Allergy   . Wheat Bran Other (See Comments)    All Wheat Products  . Eggs Or Egg-Derived Products Other (See Comments)    Sensitivity    Current Outpatient Prescriptions:  .  chlorhexidine (PERIDEX) 0.12 % solution, Use twice daily as directed., Disp: , Rfl:  .  penicillin v potassium (VEETID) 500 MG tablet, Take 4 tablets by mouth daily., Disp: , Rfl:  .  ADVAIR DISKUS 100-50 MCG/DOSE AEPB, INHALE 1 PUFF BY MOUTH TWICE DAILY. RINSE MOUTH AND SPIT AFTER USE, Disp: 60 each, Rfl: 3 .  albuterol (VENTOLIN HFA) 108 (90 BASE) MCG/ACT inhaler, Inhale 1-2 puffs into the lungs every 4 (four) hours as needed for wheezing or shortness of breath (MAXIMUM OF 12 PUFFS PER DAY)., Disp: 18 g, Rfl:  2 .  Dexlansoprazole (DEXILANT) 30 MG capsule, TAKE 1 CAPSULE(30 MG) BY MOUTH DAILY, Disp: 30 capsule, Rfl: 5 .  diltiazem (CARDIZEM) 30 MG tablet, TAKE 1 TABLET(30 MG) BY MOUTH THREE TIMES DAILY AS NEEDED, Disp: 90 tablet, Rfl: 1 .  EPINEPHrine 0.15 MG/0.15ML IJ injection, Inject 0.15 mLs (0.15 mg total) into the muscle as needed for anaphylaxis., Disp: 2 Device, Rfl: 2 .  fexofenadine (ALLEGRA) 180 MG tablet, Take 180 mg by mouth daily as needed for allergies or rhinitis. Reported on 08/23/2015, Disp: , Rfl:  .  fluticasone (FLONASE) 50 MCG/ACT nasal spray, USE 2 SPRAYS IN EACH NOSTRIL DAILY AS NEEDED, Disp: 16 g, Rfl: 5 .  Probiotic Product (PROBIOTIC DAILY) CAPS, Take by mouth daily. Reported on 08/23/2015, Disp: , Rfl:   Objective: BP 128/82   Pulse 70   Temp 98.3 F (36.8 C) (Oral)   Ht 6\' 1"  (1.854 m)   Wt 231 lb 12.8 oz (105.1 kg)   SpO2 98%   BMI 30.58 kg/m  General: Awake, appears stated age HEENT: MMM, EOMi Heart: RRR, no murmurs Lungs: CTAB, no rales, wheezes or rhonchi. No accessory muscle use Abd: BS+, soft, NT, ND, no masses or organomegaly Psych: Age appropriate judgment and insight, normal affect and mood  Assessment and Plan: Moderate persistent asthma without complication  Asthma due to environmental allergies - Plan: fluticasone (FLONASE) 50 MCG/ACT nasal spray, EPINEPHrine 0.15 MG/0.15ML IJ injection  Gastroesophageal reflux disease without esophagitis - Plan: Dexlansoprazole (DEXILANT) 30 MG capsule  Esophageal spasm - Plan: diltiazem (CARDIZEM) 30 MG tablet  Orders as above. Could try to use albuterol just prior to his run to see if it affects his breathing. Cold air is likely a trigger for him.  Instructed him to call GI for colonoscopy. Will let us know if he needs a referral. F/u in spring for his physical. After that, will see him every 6 mo unless needed. The patient voiced understanding and agreement to the plan.  Jilda Rocheicholas Paul [place] Mountain[place], DO 08/14/16   3:27 PM

## 2016-08-14 NOTE — Progress Notes (Signed)
Pre visit review using our clinic review tool, if applicable. No additional management support is needed unless otherwise documented below in the visit note. 

## 2016-08-18 ENCOUNTER — Telehealth: Payer: Self-pay

## 2016-08-18 DIAGNOSIS — J45909 Unspecified asthma, uncomplicated: Secondary | ICD-10-CM

## 2016-08-18 MED ORDER — EPINEPHRINE 0.15 MG/0.15ML IJ SOAJ: 0.1500 mg | INTRAMUSCULAR | 2 refills | Status: DC | PRN

## 2016-08-18 NOTE — Telephone Encounter (Signed)
I have alerted pharamacy that Dr. Carmelia RollerWendling wanted the original one he ordered. TL/CMA

## 2016-08-18 NOTE — Addendum Note (Signed)
Addended by: Kandace BlitzLONG, Geraline Halberstadt M on: 08/18/2016 10:29 AM   Modules accepted: Orders

## 2016-08-18 NOTE — Telephone Encounter (Signed)
Rx was sent to the pharmacy on 08/14/16. No need to refill at this time.  Tl/CMA

## 2016-08-18 NOTE — Telephone Encounter (Signed)
Yes, that's correct. TY.

## 2016-08-18 NOTE — Telephone Encounter (Signed)
I have received notification from pharmacy regarding Epi pen. [facility] states Lobbyist" Epipen Junior was ordered and wanted clarification regarding Rx. [facility] informed me Epipen 0.3mg  should be ordered for patient and I wanted to ok to if we could change this. Pharmacy states 0.15 mg are usually ordered for kids. Please advise. TL/CMA

## 2016-08-19 ENCOUNTER — Ambulatory Visit (INDEPENDENT_AMBULATORY_CARE_PROVIDER_SITE_OTHER): Payer: 59 | Admitting: Psychology

## 2016-08-19 DIAGNOSIS — F4322 Adjustment disorder with anxiety: Secondary | ICD-10-CM

## 2016-09-02 ENCOUNTER — Ambulatory Visit: Payer: 59 | Admitting: Psychology

## 2016-09-09 ENCOUNTER — Ambulatory Visit (INDEPENDENT_AMBULATORY_CARE_PROVIDER_SITE_OTHER): Payer: 59 | Admitting: Psychology

## 2016-09-09 DIAGNOSIS — F4322 Adjustment disorder with anxiety: Secondary | ICD-10-CM | POA: Diagnosis not present

## 2016-09-16 ENCOUNTER — Ambulatory Visit: Payer: 59 | Admitting: Psychology

## 2016-09-23 ENCOUNTER — Ambulatory Visit: Payer: 59 | Admitting: Psychology

## 2016-10-07 ENCOUNTER — Ambulatory Visit: Payer: 59 | Admitting: Psychology

## 2016-10-08 ENCOUNTER — Other Ambulatory Visit: Payer: Self-pay | Admitting: Family Medicine

## 2016-10-08 DIAGNOSIS — K224 Dyskinesia of esophagus: Secondary | ICD-10-CM

## 2016-11-12 ENCOUNTER — Encounter: Payer: Self-pay | Admitting: Family Medicine

## 2016-11-12 ENCOUNTER — Ambulatory Visit (INDEPENDENT_AMBULATORY_CARE_PROVIDER_SITE_OTHER): Payer: 59 | Admitting: Family Medicine

## 2016-11-12 VITALS — BP 110/60 | HR 63 | Temp 98.3°F | Ht 73.0 in | Wt 226.8 lb

## 2016-11-12 DIAGNOSIS — Z0000 Encounter for general adult medical examination without abnormal findings: Secondary | ICD-10-CM

## 2016-11-12 DIAGNOSIS — Z23 Encounter for immunization: Secondary | ICD-10-CM | POA: Diagnosis not present

## 2016-11-12 LAB — COMPREHENSIVE METABOLIC PANEL
ALBUMIN: 4.1 g/dL (ref 3.5–5.2)
ALK PHOS: 65 U/L (ref 39–117)
ALT: 18 U/L (ref 0–53)
AST: 21 U/L (ref 0–37)
BILIRUBIN TOTAL: 0.8 mg/dL (ref 0.2–1.2)
BUN: 12 mg/dL (ref 6–23)
CO2: 31 mEq/L (ref 19–32)
CREATININE: 0.92 mg/dL (ref 0.40–1.50)
Calcium: 8.9 mg/dL (ref 8.4–10.5)
Chloride: 105 mEq/L (ref 96–112)
GFR: 92.13 mL/min (ref >60.00)
GLUCOSE: 97 mg/dL (ref 70–99)
Potassium: 4 mEq/L (ref 3.5–5.1)
SODIUM: 141 meq/L (ref 135–145)
TOTAL PROTEIN: 6.6 g/dL (ref 6.0–8.3)

## 2016-11-12 LAB — LIPID PANEL
CHOLESTEROL: 143 mg/dL (ref 0–200)
HDL: 33.3 mg/dL — ABNORMAL LOW (ref >39.00)
LDL Cholesterol: 89 mg/dL (ref 0–99)
NonHDL: 109.77
Total CHOL/HDL Ratio: 4
Triglycerides: 106 mg/dL (ref 0.0–149.0)
VLDL: 21.2 mg/dL (ref 0.0–40.0)

## 2016-11-12 NOTE — Addendum Note (Signed)
Addended by: Verdie ShireBAYNES, ANGELA M on: 11/12/2016 09:09 AM   Modules accepted: Orders

## 2016-11-12 NOTE — Patient Instructions (Addendum)
Call your GI for a colonoscopy appointment. If you need a referral, let us know.  Keep up the good work.

## 2016-11-12 NOTE — Progress Notes (Signed)
Pre visit review using our clinic review tool, if applicable. No additional management support is needed unless otherwise documented below in the visit note. 

## 2016-11-12 NOTE — Progress Notes (Signed)
Chief Complaint  Patient presents with  . Annual Exam    fasting    Well Male Francisco Morrison is here for a complete physical.   His last physical was >1 year ago.  Current diet: in general, a "healthy" diet   Current exercise: walk and run 6 miles, 5 days pe rweek Weight trend: stable Does pt snore? No. Daytime fatigue? No. Seat belt? Yes.     Health maintenance Colonoscopy- No Tetanus- Yes - 2012 PCV23 (due to asthma)- No HIV- Yes Prostate cancer screening- Yes   Past Medical History:  Diagnosis Date  . Anxiety   . Asthma   . GERD (gastroesophageal reflux disease)   . Schatzki's ring   . Sliding hiatal hernia   . Vitamin D deficiency     Past Surgical History:  Procedure Laterality Date  . TOOTH EXTRACTION    . WISDOM TOOTH EXTRACTION     Medications  Current Outpatient Prescriptions on File Prior to Visit  Medication Sig Dispense Refill  . ADVAIR DISKUS 100-50 MCG/DOSE AEPB INHALE 1 PUFF BY MOUTH TWICE DAILY. RINSE MOUTH AND SPIT AFTER USE 60 each 3  . albuterol (VENTOLIN HFA) 108 (90 BASE) MCG/ACT inhaler Inhale 1-2 puffs into the lungs every 4 (four) hours as needed for wheezing or shortness of breath (MAXIMUM OF 12 PUFFS PER DAY). 18 g 2  . chlorhexidine (PERIDEX) 0.12 % solution Use twice daily as directed.    Marland Kitchen Dexlansoprazole (DEXILANT) 30 MG capsule TAKE 1 CAPSULE(30 MG) BY MOUTH DAILY 30 capsule 5  . EPINEPHrine 0.15 MG/0.15ML IJ injection Inject 0.15 mLs (0.15 mg total) into the muscle as needed for anaphylaxis. 2 Device 2  . fexofenadine (ALLEGRA) 180 MG tablet Take 180 mg by mouth daily as needed for allergies or rhinitis. Reported on 08/23/2015    . fluticasone (FLONASE) 50 MCG/ACT nasal spray USE 2 SPRAYS IN EACH NOSTRIL DAILY AS NEEDED 16 g 5  . Probiotic Product (PROBIOTIC DAILY) CAPS Take by mouth daily. Reported on 08/23/2015     Allergies Allergies  Allergen Reactions  . Dust Mite Extract   . Other     Grass, Mold, Mildew  . Shellfish Allergy   .  Wheat Bran Other (See Comments)    All Wheat Products  . Eggs Or Egg-Derived Products Other (See Comments)    Sensitivity   Family History Family History  Problem Relation Age of Onset  . Diabetes Mother   . Hypertension Father   . Heart disease Father   . Diabetes Brother   . Diabetes Maternal Grandmother   . Cancer Paternal Grandfather     Brain Tumor  . Colon cancer Neg Hx     Review of Systems: Constitutional:  no unexpected change in weight, no fevers or chills Eye:  no recent significant change in vision Ear/Nose/Mouth/Throat:  Ears:  no tinnitus or hearing loss Nose/Mouth/Throat:  no complaints of nasal congestion or bleeding, no sore throat and oral sores Cardiovascular:  no chest pain, no palpitations Respiratory:  no cough and no shortness of breath Gastrointestinal:  no abdominal pain, no change in bowel habits, no nausea, vomiting, diarrhea, or constipation and no black or bloody stool GU:  Male: negative for dysuria, frequency, and incontinence and negative for prostate symptoms Musculoskeletal/Extremities:  no pain, redness, or swelling of the joints Integumentary (Skin/Breast):  no abnormal skin lesions reported Neurologic:  no headaches, no numbness, tingling Endocrine:  weight changes, masses in the neck, heat/cold intolerance, bowel or skin changes, or  cardiovascular system symptoms Hematologic/Lymphatic:  no abnormal bleeding, no HIV risk factors, no night sweats, no swollen nodes, no weight loss  Exam BP 110/60 (BP Location: Left Arm, Patient Position: Sitting, Cuff Size: Large)   Pulse 63   Temp 98.3 F (36.8 C) (Oral)   Ht 6\' 1"  (1.854 m)   Wt 226 lb 12.8 oz (102.9 kg)   SpO2 97%   BMI 29.92 kg/m  General:  well developed, well nourished, in no apparent distress Skin:  no significant moles, warts, or growths Head:  no masses, lesions, or tenderness Eyes:  pupils equal and round, sclera anicteric without injection Ears:  canals without lesions, TMs  shiny without retraction, no obvious effusion, no erythema Nose:  nares patent, septum midline, mucosa normal Throat/Pharynx:  lips and gingiva without lesion; tongue and uvula midline; mildly inflamed pharynx; no exudates or postnasal drainage Neck: neck supple without adenopathy, thyromegaly, or masses Lungs:  clear to auscultation, breath sounds equal bilaterally, no respiratory distress Cardio:  regular rate and rhythm without murmurs, heart sounds without clicks or rubs Abdomen:  abdomen soft, nontender; bowel sounds normal; no masses or organomegaly Genital (male): circumcised penis, no lesions or discharge; testes present bilaterally without masses or tenderness Rectal: Deferred Musculoskeletal:  symmetrical muscle groups noted without atrophy or deformity Extremities:  no clubbing, cyanosis, or edema, no deformities, no skin discoloration Neuro:  gait normal; deep tendon reflexes normal and symmetric Psych: well oriented with normal range of affect and appropriate judgment/insight  Assessment and Plan  Well adult exam - Plan: Comprehensive metabolic panel, Lipid panel   Well 51 y.o. male. Counseled on diet and exercise. He is largely doing well with exercise. Discussed screening for prostate cancer, pros and cons. We agreed to recheck every other year. He is going to have it rechecked next year. Immunizations, labs, and further orders as above. Update PCV23 given asthma dx. Call for pick up and fax work physical form.  Follow up in 6 mo pending above. The patient voiced understanding and agreement to the plan.  Jilda Rocheicholas Paul [place], DO 11/12/16 8:23 AM

## 2016-11-18 ENCOUNTER — Telehealth: Payer: Self-pay | Admitting: *Deleted

## 2016-11-18 NOTE — Telephone Encounter (Signed)
Received completed and signed Physician Results Form from Dr. Carmelia Roller.  Form faxed to [facility] @ [phone]).  Confirmation received.  Called and [facility] @ 9:13am @ [phone]) informing the pt that the form has been completed and faxed and ready to be picked up.  Asked the pt to RTC to let me know if he wants to pick up the form.//AB/CMA

## 2016-11-18 NOTE — Telephone Encounter (Addendum)
Called and spoke with the pt and informed him that his form is ready for pick up.  He stated that he will be by today to pick it up.  Informed him I will leave the form up front.  Also informed the pt that the form has his DOB incorrectly.  Pt verbalized DOB and stated that he will follow-up on the error.  Copy made and sent to be scanned into the chart/.//AB/CMA

## 2016-11-30 ENCOUNTER — Other Ambulatory Visit: Payer: Self-pay | Admitting: Physician Assistant

## 2017-02-02 ENCOUNTER — Other Ambulatory Visit: Payer: Self-pay | Admitting: Family Medicine

## 2017-02-02 DIAGNOSIS — J45909 Unspecified asthma, uncomplicated: Secondary | ICD-10-CM

## 2017-02-08 ENCOUNTER — Other Ambulatory Visit: Payer: Self-pay | Admitting: Family Medicine

## 2017-02-08 DIAGNOSIS — K219 Gastro-esophageal reflux disease without esophagitis: Secondary | ICD-10-CM

## 2017-02-08 NOTE — Telephone Encounter (Signed)
Rx approved and sent to the pharmacy by e-script.//AB/CMA 

## 2017-03-07 ENCOUNTER — Other Ambulatory Visit: Payer: Self-pay | Admitting: Family Medicine

## 2017-03-07 DIAGNOSIS — J45909 Unspecified asthma, uncomplicated: Secondary | ICD-10-CM

## 2017-03-08 NOTE — Telephone Encounter (Signed)
Rx approved and sent to the pharmacy by e-script.//AB/CMA 

## 2017-04-02 ENCOUNTER — Encounter: Payer: Self-pay | Admitting: Family Medicine

## 2017-04-02 ENCOUNTER — Ambulatory Visit (INDEPENDENT_AMBULATORY_CARE_PROVIDER_SITE_OTHER): Payer: 59 | Admitting: Family Medicine

## 2017-04-02 VITALS — BP 116/76 | HR 66 | Temp 98.3°F | Ht 73.0 in | Wt 221.2 lb

## 2017-04-02 DIAGNOSIS — R0789 Other chest pain: Secondary | ICD-10-CM

## 2017-04-02 DIAGNOSIS — F410 Panic disorder [episodic paroxysmal anxiety] without agoraphobia: Secondary | ICD-10-CM | POA: Diagnosis not present

## 2017-04-02 MED ORDER — HYDROXYZINE HCL 50 MG PO TABS: 50.0000 mg | ORAL_TABLET | Freq: Two times a day (BID) | ORAL | 0 refills | Status: DC | PRN

## 2017-04-02 NOTE — Patient Instructions (Signed)
Give me time to reach out and see if any counselors/psychologists specialize in managing people who deal with bullying.   If you start having exertional chest pain/discomfort, seek immediate care or let me know.  Let us know if you need anything.

## 2017-04-02 NOTE — Progress Notes (Signed)
Chief Complaint  Patient presents with  . Panic Attack    Patient is here today C/O a panic attack this am that is the first one in 2 years.    Subjective: Patient is a 51 y.o. male here for possible panic attack.  This happened this AM around 9 AM. 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 He was in his car and felt a chest discomfort for around 5 minutes where he couldn't move and was petrified. For the next 10 minutes, he stayed in his car, feeling better but still unable to move. He had a panic attack issue similar to this around 2 years ago. He was not physically active and notes his HR at the time was 53-55. He has no remarkable cardiac hx.   ROS: Psych: As noted in HPI Lungs: Denies SOB   Family History  Problem Relation Age of Onset  . Diabetes Mother   . Hypertension Father   . Heart disease Father   . Diabetes Brother   . Diabetes Maternal Grandmother   . Cancer Paternal Grandfather        Brain Tumor  . Colon cancer Neg Hx    Past Medical History:  Diagnosis Date  . Anxiety   . Asthma   . GERD (gastroesophageal reflux disease)   . Schatzki's ring   . Sliding hiatal hernia   . Vitamin D deficiency    Allergies  Allergen Reactions  . Dust Mite Extract   . Other     Grass, Mold, Mildew  . Shellfish Allergy   . Wheat Bran Other (See Comments)    All Wheat Products  . Eggs Or Egg-Derived Products Other (See Comments)    Sensitivity    Current Outpatient Prescriptions:  .  ADVAIR DISKUS 100-50 MCG/DOSE AEPB, INHALE 1 PUFF BY MOUTH TWICE DAILY. RINSE MOUTH AND SPIT AFTER USE, Disp: 1 each, Rfl: 5 .  DEXILANT 30 MG capsule, TAKE 1 CAPSULE(30 MG) BY MOUTH DAILY, Disp: 30 capsule, Rfl: 5 .  albuterol (VENTOLIN HFA) 108 (90 BASE) MCG/ACT inhaler, Inhale 1-2 puffs into the lungs every 4 (four) hours as needed for wheezing or shortness of breath (MAXIMUM OF 12 PUFFS PER DAY).  (Patient not taking: Reported on 04/02/2017), Disp: 18 g, Rfl: 2 .  hydrOXYzine (ATARAX/VISTARIL) 50 MG tablet, Take 1-2 tablets (50-100 mg total) by mouth 2 (two) times daily as needed for anxiety., Disp: 60 tablet, Rfl: 0  Objective: BP 116/76 (BP Location: Left Arm, Patient Position: Sitting, Cuff Size: Normal)   Pulse 66   Temp 98.3 F (36.8 C) (Oral)   Ht 6\' 1"  (1.854 m)   Wt 221 lb 3.2 oz (100.3 kg)   SpO2 96%   BMI 29.18 kg/m  General: Awake, appears stated age HEENT: MMM, EOMi Heart: RRR, no murmurs, no LE edema, no bruits MSK: Chest pain is not reproducible to palpation Lungs: CTAB, no rales, wheezes or rhonchi. No accessory muscle use Abd: BS+, soft, NT, ND, no masses or organomegaly Psych: Age appropriate judgment and insight, normal affect and mood  Assessment and Plan: Panic attack - Plan: hydrOXYzine (ATARAX/VISTARIL) 50 MG tablet  Atypical chest pain - Plan: EKG 12-Lead  Orders as above. EKG reassuring. We discussed therapy versus starting a medication for anxiety versus starting an as needed medication. He requested an as needed medication. We'll start the nonaddictive hydroxyzine. He is happy with this  choice. I will recheck out and see if there are any therapists in the area that would help with the area of bullying.  F/u in 2 mo to recheck how stress levels are doing. Seek immediate care if he starts to have SOB or exertional symptoms. RTC sooner if panic attacks happen more frequently.  The patient voiced understanding and agreement to the plan.  Jilda Roche [place], DO 04/02/17  4:58 PM

## 2017-04-07 ENCOUNTER — Other Ambulatory Visit: Payer: Self-pay | Admitting: Family Medicine

## 2017-04-07 DIAGNOSIS — J45909 Unspecified asthma, uncomplicated: Secondary | ICD-10-CM

## 2017-04-12 ENCOUNTER — Telehealth: Payer: Self-pay | Admitting: Family Medicine

## 2017-04-12 NOTE — Telephone Encounter (Signed)
Called left message to call back 

## 2017-04-12 NOTE — Telephone Encounter (Signed)
Please let Francisco Morrison know that after asking around, anyone trained to provide counseling should be able to manage his specific issue in mind. As he has seen [name] before, I would recommend calling [phone] and asking for another provider. Let us know if there are any questions. TY.

## 2017-04-13 NOTE — Telephone Encounter (Signed)
Pt returned call. Made pt aware of message below. He said that he understands and will call to schedule.

## 2017-04-20 IMAGING — RF DG ESOPHAGUS
12 of 19 series · 14 of 24 positions shown · non-contrast
Comparison: None.

CLINICAL DATA: 49-year-old male with reflux symptoms including
belching and chest tightness. Difficulty swallowing solid food.
Initial encounter.

EXAM:
ESOPHOGRAM / BARIUM SWALLOW / BARIUM TABLET STUDY
TECHNIQUE: Combined double contrast and single contrast examination performed
using effervescent crystals, thick barium liquid, and thin barium
liquid. The patient was observed with fluoroscopy swallowing a 13 mm
barium sulphate tablet.
FLUOROSCOPY TIME:  Radiation Exposure Index (as provided by the
fluoroscopic device): 15.6 micro Gray

[Series 1: fluoro_barium 2fps_bw · 0.17mm/px · 2 of 10 frames shown (1 of 3)]
[frame 1/10]
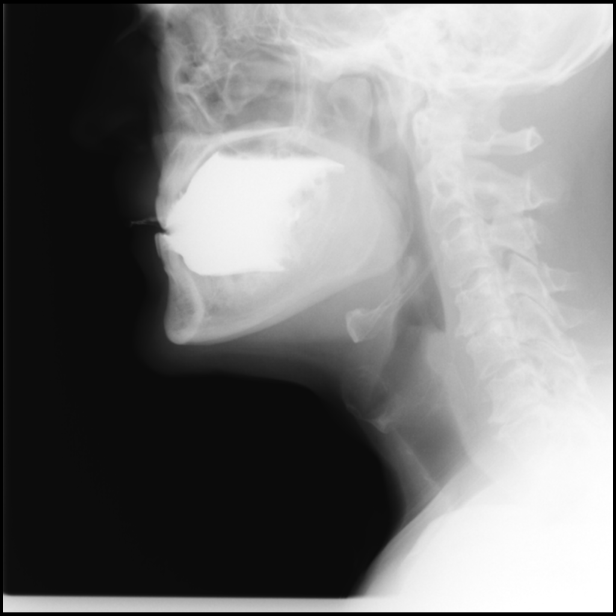
[frame 9/10]
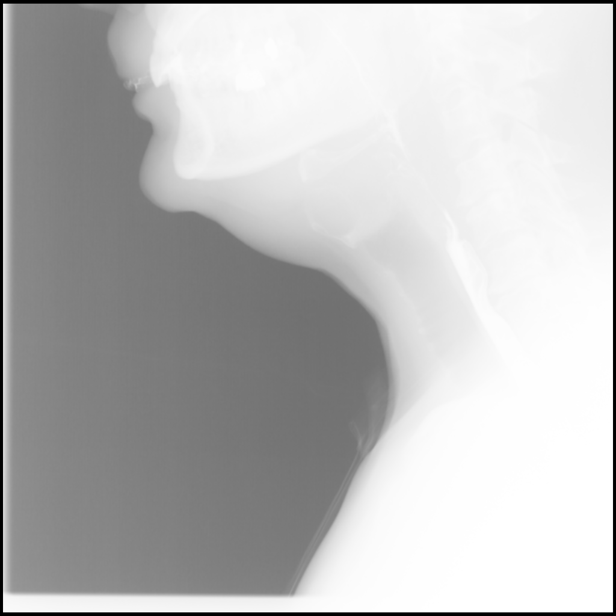

[Series 2: fluoro_barium 2fps_bw · 0.17mm/px · 1 of 7 frames shown (2 of 3)]
[frame 4/7]
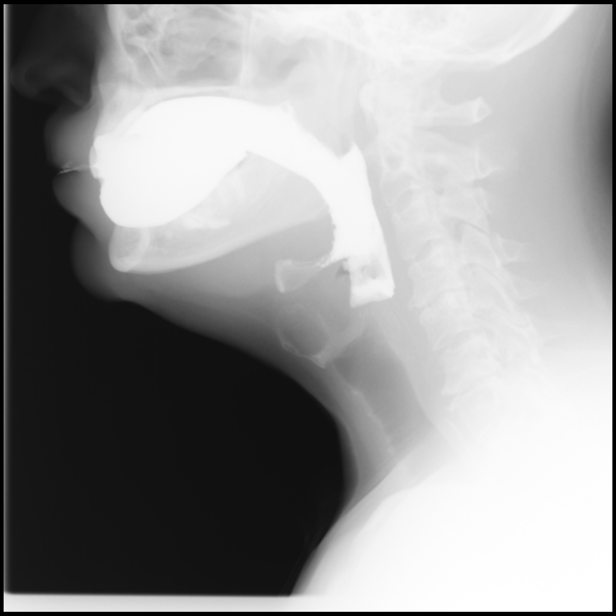

[Series 3: fluoro_barium 2fps_bw · 0.18mm/px · 2 of 13 frames shown (3 of 3)]
[frame 7/13]
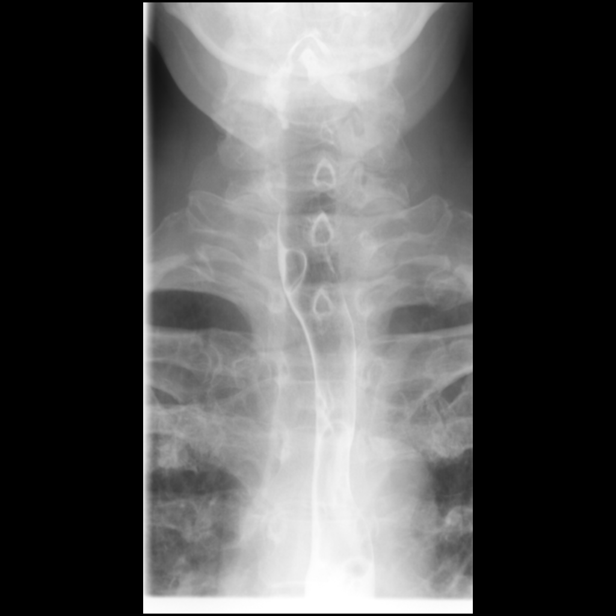
[frame 10/13]
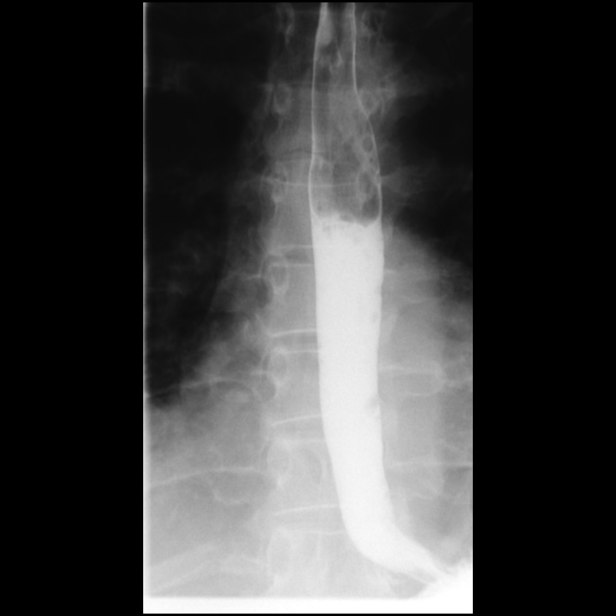

[Series 5: cp_standard · 0.28mm/px · 1 of 1 slices shown (1 of 9)]
[im 1/1]
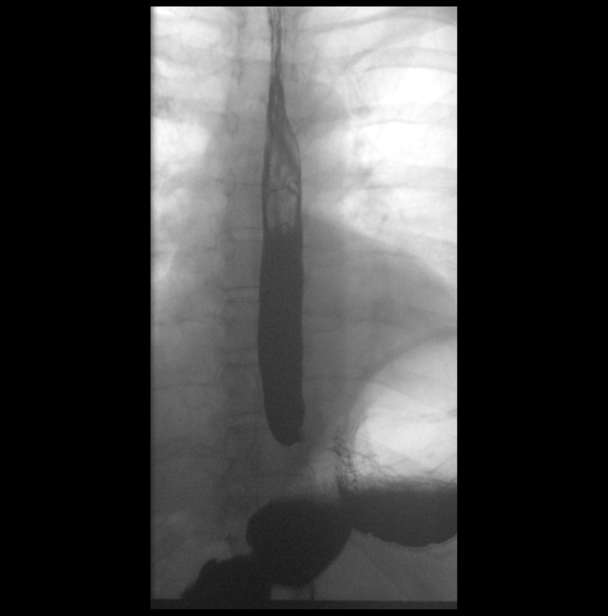

[Series 8: cp_standard · 0.28mm/px · 1 of 1 slices shown (2 of 9)]
[im 1/1]
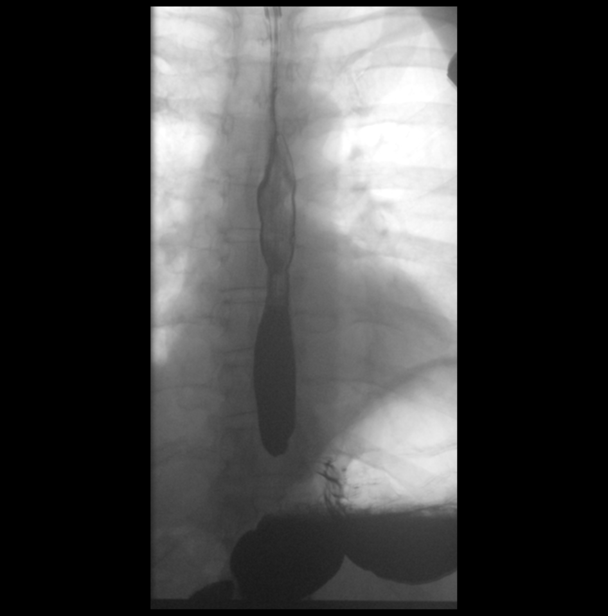

[Series 9: cp_standard · 0.28mm/px · 1 of 1 slices shown (3 of 9)]
[im 1/1]
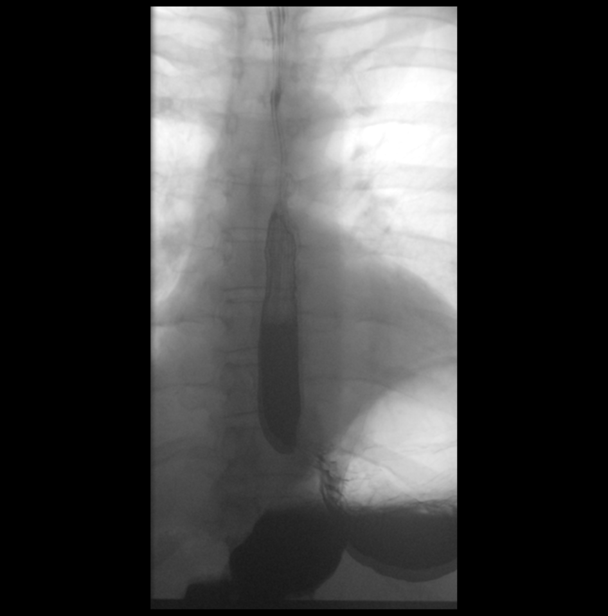

[Series 12: cp_standard · 0.28mm/px · 1 of 1 slices shown (4 of 9)]
[im 1/1]
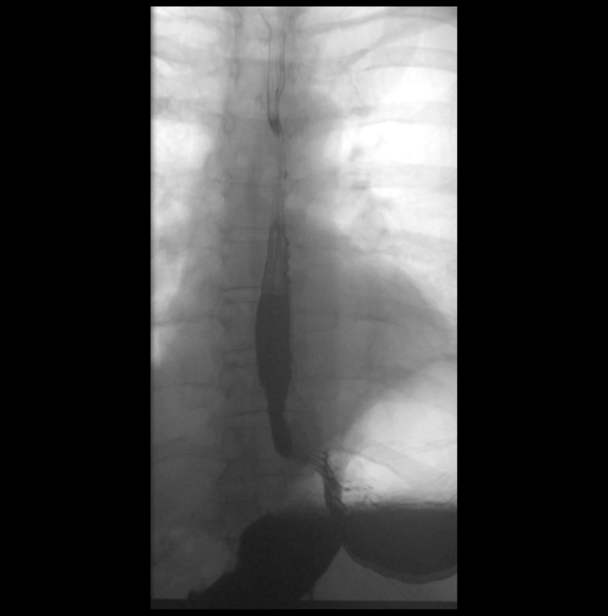

[Series 15: cp_standard · 0.28mm/px · 1 of 1 slices shown (5 of 9)]
[im 1/1]
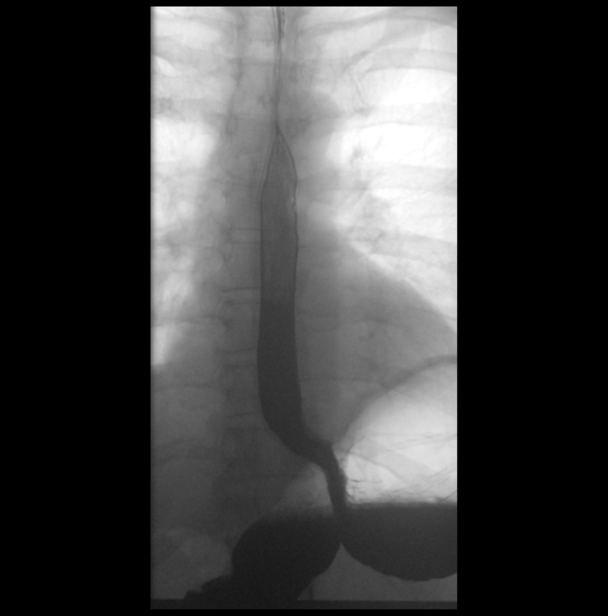

[Series 17: cp_standard · 0.28mm/px · 1 of 1 slices shown (6 of 9)]
[im 1/1]
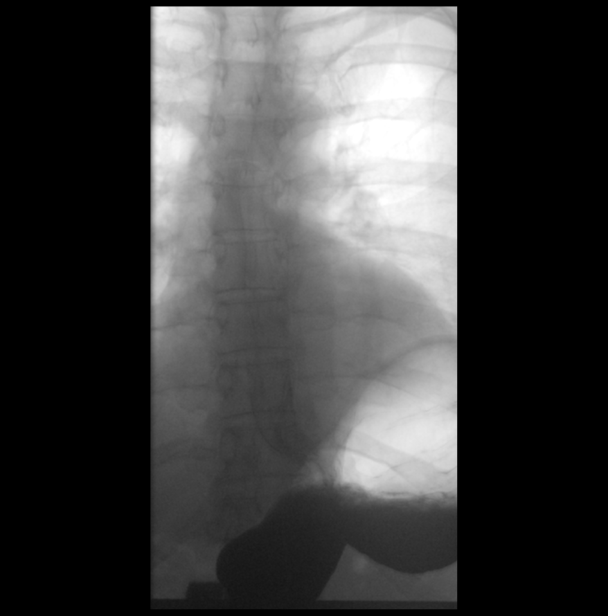

[Series 19: cp_standard · 0.27mm/px · 1 of 1 slices shown (7 of 9)]
[im 1/1]
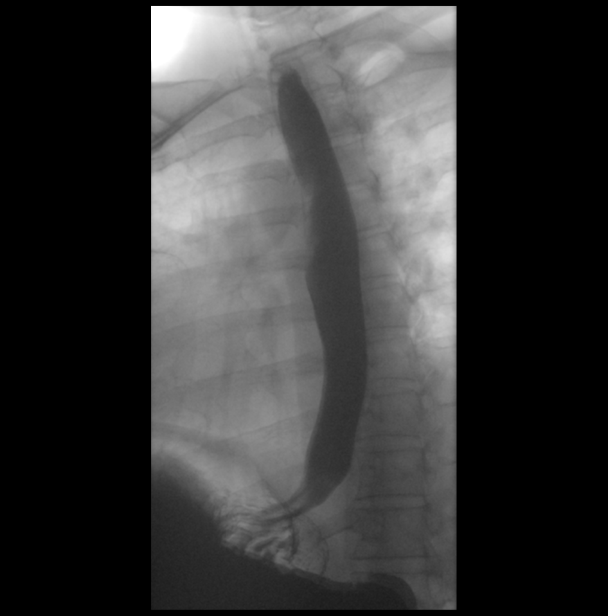

[Series 21: cp_standard · 0.27mm/px · 1 of 1 slices shown (8 of 9)]
[im 1/1]
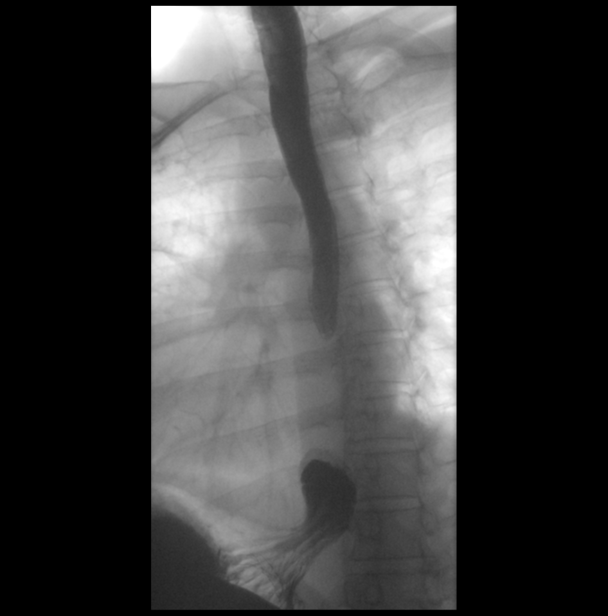

[Series 24: cp_standard · 0.28mm/px · 1 of 1 slices shown (9 of 9)]
[im 1/1]
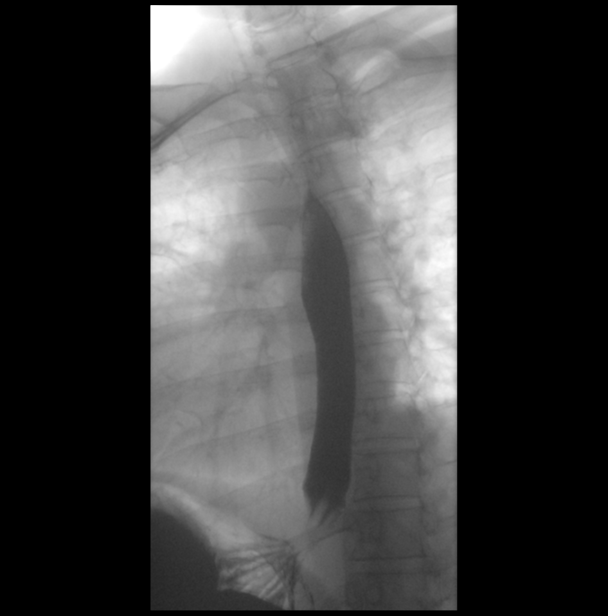

[14 of 24 positions shown; findings below may reference images not displayed]

FINDINGS: No laryngeal penetration and aspiration.

Impression upon the posterior aspect of the esophagus by anterior
osteophyte C4-5 and C6-7 level.

Normal primary esophageal stripping wave without esophageal
obstructing lesion. Patient ingested a 13 mm barium tablet without
difficulty.

Transient small sliding type hiatal hernia with mild concentric
smooth narrowing just above the hiatal hernia.

Gastroesophageal reflux noted with change of patient position
reaching the mid thoracic esophagus level. No discrete mucosa
abnormality detected.
IMPRESSION: Impression upon the posterior aspect of the esophagus by anterior
osteophyte C4-5 and C6-7 level.

Normal primary esophageal stripping wave without esophageal
obstructing lesion.

Transient small sliding type hiatal hernia with mild concentric
smooth narrowing just above the hiatal hernia.

Gastroesophageal reflux noted with change of patient position
reaching the mid thoracic esophagus level.

No discrete mucosa abnormality detected.

Patient ingested a 13 mm barium tablet without difficulty.

## 2017-05-10 ENCOUNTER — Other Ambulatory Visit: Payer: Self-pay | Admitting: Family Medicine

## 2017-05-10 DIAGNOSIS — J45909 Unspecified asthma, uncomplicated: Secondary | ICD-10-CM

## 2017-06-08 ENCOUNTER — Telehealth: Payer: 59 | Admitting: Family

## 2017-06-08 DIAGNOSIS — R6889 Other general symptoms and signs: Secondary | ICD-10-CM

## 2017-06-08 NOTE — Progress Notes (Signed)

## 2017-06-09 ENCOUNTER — Encounter: Payer: Self-pay | Admitting: Family Medicine

## 2017-06-12 ENCOUNTER — Other Ambulatory Visit: Payer: Self-pay | Admitting: Family Medicine

## 2017-06-12 DIAGNOSIS — J45909 Unspecified asthma, uncomplicated: Secondary | ICD-10-CM

## 2017-07-11 ENCOUNTER — Other Ambulatory Visit: Payer: Self-pay | Admitting: Family Medicine

## 2017-07-11 DIAGNOSIS — J45909 Unspecified asthma, uncomplicated: Secondary | ICD-10-CM

## 2017-08-10 ENCOUNTER — Other Ambulatory Visit: Payer: Self-pay | Admitting: Family Medicine

## 2017-08-10 DIAGNOSIS — J45909 Unspecified asthma, uncomplicated: Secondary | ICD-10-CM

## 2017-08-11 ENCOUNTER — Other Ambulatory Visit: Payer: Self-pay | Admitting: Family Medicine

## 2017-08-11 DIAGNOSIS — K219 Gastro-esophageal reflux disease without esophagitis: Secondary | ICD-10-CM

## 2017-08-13 ENCOUNTER — Other Ambulatory Visit: Payer: Self-pay | Admitting: Family Medicine

## 2017-08-13 DIAGNOSIS — F410 Panic disorder [episodic paroxysmal anxiety] without agoraphobia: Secondary | ICD-10-CM

## 2017-08-30 ENCOUNTER — Encounter: Payer: Self-pay | Admitting: Family Medicine

## 2017-08-30 ENCOUNTER — Ambulatory Visit: Payer: 59 | Admitting: Family Medicine

## 2017-08-30 VITALS — BP 100/72 | HR 67 | Temp 98.6°F | Ht 73.0 in | Wt 223.0 lb

## 2017-08-30 DIAGNOSIS — F418 Other specified anxiety disorders: Secondary | ICD-10-CM

## 2017-08-30 NOTE — Patient Instructions (Addendum)
Goodrx is an app worth investing in.  Consider the [facility] and [facility] for your care. They have more resources available for uninsured patients and may be better able to provide you the care you need.  [address] [place], [place] [address] [phone]   Lexapro (escitalopram) is the medicine I am considering if you stay with your job.  Symbicort, Lavada MesiBreo, Dulera are similar to Advair.   Let us know if you need anything.

## 2017-08-30 NOTE — Progress Notes (Signed)
Chief Complaint  Patient presents with  . Anxiety    Subjective Francisco Morrison presents for f/u anxiety/depression.  Hx of intermittent anxiety, worsened by stress from work. He is currently being treated with Atarax prn that is working well, does make him drowsy. His anxiety has increased recently due to mounting stress at work. Not on a daily medicine. Assoc s/s's include IBS related issues that improve after meetings or when he is not thinking about work, racing thoughts, fatigue, and paresthesias (also arising when he is thinking about work). He is thinking about quitting his job.  Running and walking for exercise.  No thoughts of harming self or others. No self-medication with alcohol, prescription drugs or illicit drugs. Pt is not following with a counselor/psychologist.  ROS Psych: No homicidal or suicidal thoughts GI: +freq stooling  Past Medical History:  Diagnosis Date  . Anxiety   . Asthma   . GERD (gastroesophageal reflux disease)   . Schatzki's ring   . Sliding hiatal hernia   . Vitamin D deficiency    Allergies as of 08/30/2017      Reactions   Dust Mite Extract    Other    Grass, Mold, Mildew   Shellfish Allergy    Wheat Bran Other (See Comments)   All Wheat Products   Eggs Or Egg-derived Products Other (See Comments)   Sensitivity      Medication List        Accurate as of 08/30/17  9:39 AM. Always use your most recent med list.          ADVAIR DISKUS 100-50 MCG/DOSE Aepb Generic drug:  Fluticasone-Salmeterol INHALE 1 PUFF BY MOUTH TWICE DAILY. RINSE MOUTH AND SPIT AFTER USE   albuterol 108 (90 Base) MCG/ACT inhaler Commonly known as:  VENTOLIN HFA Inhale 1-2 puffs into the lungs every 4 (four) hours as needed for wheezing or shortness of breath (MAXIMUM OF 12 PUFFS PER DAY).   DEXILANT 30 MG capsule Generic drug:  Dexlansoprazole TAKE 1 CAPSULE(30 MG) BY MOUTH DAILY   fluticasone 50 MCG/ACT nasal spray Commonly known as:  FLONASE SHAKE  LIQUID AND USE 2 SPRAYS IN EACH NOSTRIL DAILY AS NEEDED   hydrOXYzine 25 MG tablet Commonly known as:  ATARAX/VISTARIL Take 1 tablet (25 mg total) by mouth 3 (three) times daily as needed for anxiety.       Past Surgical History:  Procedure Laterality Date  . TOOTH EXTRACTION    . WISDOM TOOTH EXTRACTION     Exam BP 100/72 (BP Location: Left Arm, Patient Position: Sitting, Cuff Size: Large)   Pulse 67   Temp 98.6 F (37 C) (Oral)   Ht 6\' 1"  (1.854 m)   Wt 223 lb (101.2 kg)   SpO2 97%   BMI 29.42 kg/m  General:  well developed, well nourished, in no apparent distress Neck: neck supple without adenopathy, thyromegaly, or masses Lungs:  clear to auscultation, breath sounds equal bilaterally, no respiratory distress Cardio:  regular rate and rhythm without murmurs, heart sounds without clicks or rubs Neuro: DTR's equal and symmetric, neg Spurling's MSK: 5/5 strength throughout Abd: Soft, NT, ND, no masses or organomegaly Psych: well oriented with normal range of affect and age-appropriate judgement/insight, alert and oriented x4.  Assessment and Plan  Situational anxiety  Offered Lexapro, he would like to think about it and if he quits his job, will forego another medicine. He will let us know in 1 week.  Pt asking how much it is for self  pay here and alt options for meds. # for wellness center given.  F/u in 6 mo for med check. The patient voiced understanding and agreement to the plan.  Greater than 25 minutes were spent face to face with the patient with greater than 50% of this time spent counseling on tx options for anxiety, alternatives to quitting, and how all his symptoms are tied in.    Jilda Rocheicholas Paul [place], DO 08/30/17 9:39 AM

## 2017-08-30 NOTE — Progress Notes (Signed)
Pre visit review using our clinic review tool, if applicable. No additional management support is needed unless otherwise documented below in the visit note. 

## 2017-09-09 ENCOUNTER — Other Ambulatory Visit: Payer: Self-pay | Admitting: Family Medicine

## 2017-09-09 DIAGNOSIS — K219 Gastro-esophageal reflux disease without esophagitis: Secondary | ICD-10-CM

## 2017-09-28 ENCOUNTER — Encounter: Payer: Self-pay | Admitting: Family Medicine

## 2017-09-28 DIAGNOSIS — K219 Gastro-esophageal reflux disease without esophagitis: Secondary | ICD-10-CM

## 2017-09-29 MED ORDER — FLUTICASONE-SALMETEROL 100-50 MCG/DOSE IN AEPB: INHALATION_SPRAY | RESPIRATORY_TRACT | 3 refills | Status: DC

## 2017-09-29 MED ORDER — DEXLANSOPRAZOLE 30 MG PO CPDR: DELAYED_RELEASE_CAPSULE | ORAL | 3 refills | Status: DC

## 2017-10-08 ENCOUNTER — Other Ambulatory Visit: Payer: Self-pay | Admitting: Family Medicine

## 2017-10-08 DIAGNOSIS — K219 Gastro-esophageal reflux disease without esophagitis: Secondary | ICD-10-CM

## 2018-07-19 ENCOUNTER — Ambulatory Visit: Payer: Self-pay
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
## 2018-07-20 ENCOUNTER — Encounter: Payer: Self-pay | Admitting: Family Medicine

## 2018-07-20 ENCOUNTER — Ambulatory Visit: Payer: Self-pay | Admitting: Family Medicine

## 2018-07-20 VITALS — BP 110/68 | HR 60 | Temp 98.1°F | Ht 73.0 in | Wt 211.0 lb

## 2018-07-20 DIAGNOSIS — J4540 Moderate persistent asthma, uncomplicated: Secondary | ICD-10-CM | POA: Insufficient documentation

## 2018-07-20 MED ORDER — ALBUTEROL SULFATE HFA 108 (90 BASE) MCG/ACT IN AERS: 1.0000 | INHALATION_SPRAY | RESPIRATORY_TRACT | 2 refills | Status: DC | PRN

## 2018-07-20 MED ORDER — FLUTICASONE-SALMETEROL 100-50 MCG/DOSE IN AEPB: INHALATION_SPRAY | RESPIRATORY_TRACT | 5 refills | Status: DC
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
## 2018-07-20 NOTE — Progress Notes (Signed)
Pre visit review using our clinic review tool, if applicable. No additional management support is needed unless otherwise documented below in the visit note. 

## 2018-07-20 NOTE — Patient Instructions (Signed)
Let us know if you need anything.  

## 2018-08-18 ENCOUNTER — Encounter: Payer: Self-pay | Admitting: Family Medicine

## 2018-08-18 ENCOUNTER — Ambulatory Visit (INDEPENDENT_AMBULATORY_CARE_PROVIDER_SITE_OTHER): Payer: Self-pay | Admitting: Family Medicine

## 2018-08-18 VITALS — BP 132/82 | HR 50 | Temp 98.2°F | Ht 73.0 in | Wt 213.2 lb

## 2018-08-18 DIAGNOSIS — J4540 Moderate persistent asthma, uncomplicated: Secondary | ICD-10-CM

## 2018-08-18 NOTE — Progress Notes (Signed)
Pre visit review using our clinic review tool, if applicable. No additional management support is needed unless otherwise documented below in the visit note. 

## 2018-08-18 NOTE — Progress Notes (Signed)
Chief Complaint  Patient presents with  . contact with black mold    Subjective: Patient is a 53 y.o. male here for contact w black mold.  Due to pt's living situation, he has been exposed to black mold he believes. He has a hx of asthma that is exacerbated by such exposures. Denies any fevers, breathing more difficult and having more sob. Poor mentation also. Seems to be getting better overall since continued avoidance. Would like letter stating this is bad for his health.   ROS: Lungs: +SOB   Past Medical History:  Diagnosis Date  . Anxiety   . Asthma   . GERD (gastroesophageal reflux disease)   . Schatzki's ring   . Sliding hiatal hernia   . Vitamin D deficiency     Objective: BP 132/82 (BP Location: Left Arm, Patient Position: Sitting, Cuff Size: Normal)   Pulse (!) 50   Temp 98.2 F (36.8 C) (Oral)   Ht 6\' 1"  (1.854 m)   Wt 213 lb 4 oz (96.7 kg)   SpO2 98%   BMI 28.13 kg/m  General: Awake, appears stated age HEENT: MMM, nares patent with no lesions or cobblestoning Heart: RRR Lungs: CTAB, no rales, wheezes or rhonchi. No accessory muscle use Psych: Age appropriate judgment and insight, normal affect and mood  Assessment and Plan: Moderate persistent asthma without complication  Letter written suggesting it is better for pt to avoid mold, dust and mildew. Cont to avoid mold.  Fu if no improvement, will consider sputum sample.  The patient voiced understanding and agreement to the plan.  Jilda Roche [place], DO 08/18/18  2:42 PM

## 2018-08-18 NOTE — Patient Instructions (Signed)
Keep up the good work.   Avoid triggers as best you can.   Let us know if you need anything.

## 2018-09-12 ENCOUNTER — Encounter: Payer: Self-pay | Admitting: Family Medicine

## 2018-09-12 ENCOUNTER — Ambulatory Visit: Payer: Self-pay | Admitting: Family Medicine

## 2018-09-12 VITALS — BP 120/78 | HR 62 | Temp 98.2°F | Ht 73.0 in | Wt 210.0 lb

## 2018-09-12 DIAGNOSIS — T7840XA Allergy, unspecified, initial encounter: Secondary | ICD-10-CM

## 2018-09-12 MED ORDER — HYDROXYZINE HCL 25 MG PO TABS: 25.0000 mg | ORAL_TABLET | Freq: Three times a day (TID) | ORAL | 5 refills | Status: AC | PRN

## 2018-09-12 NOTE — Progress Notes (Signed)
Chief Complaint  Patient presents with  . Follow-up    Subjective: Patient is a 53 y.o. male here for f/u.  Over the past month, the patient has moved out of his apartment with mold.  He has had 3 occasions with the left side of his body becomes red and itchy.  He is tried taking Benadryl and hydrocortisone that did seem to be helpful.  No new lotions, soaps, topicals, or detergents.  He denies any shortness of breath, swelling.   ROS: Lungs: Denies SOB   Past Medical History:  Diagnosis Date  . Anxiety   . Asthma   . GERD (gastroesophageal reflux disease)   . Schatzki's ring   . Sliding hiatal hernia   . Vitamin D deficiency     Objective: BP 120/78 (BP Location: Left Arm, Patient Position: Sitting, Cuff Size: Normal)   Pulse 62   Temp 98.2 F (36.8 C) (Oral)   Ht 6\' 1"  (1.854 m)   Wt 210 lb (95.3 kg)   SpO2 97%   BMI 27.71 kg/m  General: Awake, appears stated age HEENT: MMM, no edema or asymmetry noted Heart: RRR, no murmurs Lungs: CTAB, no rales, wheezes or rhonchi. No accessory muscle use Skin: Dry skin on dorsum of both hands, some excoriation noted over the lateral dorsum of the hand bilaterally, no current lesions on the torso or lower extremities Psych: Age appropriate judgment and insight, normal affect and mood  Assessment and Plan: Allergic state, initial encounter  Recommended oral antihistamine.  He is not having a red flags.  If no improvement, will consider allergy testing.  Avoid scented products.  He has an EpiPen. The patient voiced understanding and agreement to the plan.  Jilda Roche [place], DO 09/12/18  11:21 AM

## 2018-09-12 NOTE — Progress Notes (Signed)
Pre visit review using our clinic review tool, if applicable. No additional management support is needed unless otherwise documented below in the visit note. 

## 2018-09-12 NOTE — Patient Instructions (Addendum)
Claritin (loratadine), Allegra (fexofenadine), Zyrtec (cetirizine); these are listed in order from weakest to strongest. Generic, and therefore cheaper, options are in the parentheses.   There are available OTC, and the generic versions, which may be cheaper, are in parentheses. Show this to a pharmacist if you have trouble finding any of these items.  Get your Epipen if possible.  Let us know if you need anything.

## 2018-09-26 ENCOUNTER — Encounter: Payer: Self-pay | Admitting: Family Medicine

## 2018-09-29 ENCOUNTER — Encounter: Payer: Self-pay | Admitting: Family Medicine

## 2018-09-29 NOTE — Telephone Encounter (Signed)
Pt would like

## 2018-10-11 ENCOUNTER — Encounter: Payer: Self-pay | Admitting: Family Medicine

## 2018-10-12 ENCOUNTER — Other Ambulatory Visit: Payer: Self-pay | Admitting: Family Medicine

## 2018-10-12 DIAGNOSIS — J302 Other seasonal allergic rhinitis: Secondary | ICD-10-CM

## 2018-11-17 ENCOUNTER — Ambulatory Visit: Payer: BLUE CROSS/BLUE SHIELD | Admitting: Allergy & Immunology

## 2018-11-24 ENCOUNTER — Ambulatory Visit (INDEPENDENT_AMBULATORY_CARE_PROVIDER_SITE_OTHER): Payer: Self-pay | Admitting: Family Medicine

## 2018-11-24 ENCOUNTER — Other Ambulatory Visit: Payer: Self-pay

## 2018-11-24 ENCOUNTER — Encounter: Payer: Self-pay | Admitting: Family Medicine

## 2018-11-24 DIAGNOSIS — J4540 Moderate persistent asthma, uncomplicated: Secondary | ICD-10-CM

## 2018-11-24 DIAGNOSIS — S76212A Strain of adductor muscle, fascia and tendon of left thigh, initial encounter: Secondary | ICD-10-CM

## 2018-11-24 MED ORDER — FLUTICASONE-SALMETEROL 250-50 MCG/DOSE IN AEPB: 1.0000 | INHALATION_SPRAY | Freq: Two times a day (BID) | RESPIRATORY_TRACT | 5 refills | Status: AC

## 2018-11-24 NOTE — Progress Notes (Addendum)
Virtual Visit via Video Note  I connected with patient on 11/24/18 at  1:15 PM EDT by a video enabled telemedicine application and verified that I am speaking with the correct person using two identifiers.   I discussed the limitations of evaluation and management by telemedicine and the availability of in person appointments. The patient expressed understanding and agreed to proceed.  History of Present Illness: Pt has a hx of asthma, exacerbated by allergens. He takes Advair 100-50 mcg 1 puff bid and saba prn. Also uses INCS for allergies. Since pollen season started, he has been having worsening sob.   Strained L groin around 6 weeks ago while moving. 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 Has trouble doing butterfly stretch and sitting cross legged. Having trouble running as well. No bruising, bulges or swelling.    Observations/Objective: No conversational dyspnea Age appropriate judgment and insight Nml affect and mood  Assessment and Plan: Moderate persistent asthma without complication - Plan: Fluticasone-Salmeterol (ADVAIR) 250-50 MCG/DOSE AEPB  Groin strain, left, initial encounter  Orders as above. Increase dose of ICS portion of Advair.  Cont SABA, Zyrtec and INCS.  Heat, ice, tylenol, stretches/exercises. Let me know in 2 weeks if no better and we will set up with PT or see a sports med doc.   Follow Up Instructions: 1 mo to reck   I discussed the assessment and treatment plan with the patient. The patient was provided an opportunity to ask questions and all were answered. The patient agreed with the plan and demonstrated an understanding of the instructions.   The patient was advised to call back or seek an in-person evaluation if the symptoms worsen or if the condition fails to improve as anticipated.   Jilda Roche [place], DO

## 2018-12-15 ENCOUNTER — Other Ambulatory Visit: Payer: Self-pay

## 2018-12-15 ENCOUNTER — Ambulatory Visit (INDEPENDENT_AMBULATORY_CARE_PROVIDER_SITE_OTHER): Payer: Self-pay | Admitting: Allergy & Immunology

## 2018-12-15 ENCOUNTER — Encounter: Payer: Self-pay | Admitting: Allergy & Immunology

## 2018-12-15 VITALS — BP 122/74 | HR 76 | Temp 98.1°F | Resp 16 | Ht 72.0 in | Wt 229.0 lb

## 2018-12-15 DIAGNOSIS — T781XXD Other adverse food reactions, not elsewhere classified, subsequent encounter: Secondary | ICD-10-CM

## 2018-12-15 DIAGNOSIS — J4540 Moderate persistent asthma, uncomplicated: Secondary | ICD-10-CM

## 2018-12-15 DIAGNOSIS — J310 Chronic rhinitis: Secondary | ICD-10-CM

## 2018-12-15 NOTE — Patient Instructions (Addendum)
1. Moderate persistent asthma, uncomplicated - Lung testing actually looks good today.  - We are going to change you to Symbicort instead of Advair since you felt it worked better.  - Fill out the [place] and Me form to try to get the Symbicort for free. - In the interim, we are going provide you with some samples.  - We are going to get some blood work in case we need to start an injectable medication for your asthma (Xolair, [place] Ch[place]enger, or [place]).  - Spacer sample and demonstration provided. - Daily controller medication(s): Symbicort 160/4.23mcg two puffs twice daily with spacer - Prior to physical activity: albuterol 2 puffs 10-15 minutes before physical activity. - Rescue medications: albuterol 4 puffs every 4-6 hours as needed - Asthma control goals:  * Full participation in all desired activities (may need albuterol before activity) * Albuterol use two time or less a week on average (not counting use with activity) * Cough interfering with sleep two time or less a month * Oral steroids no more than once a year * No hospitalizations  2. Chronic rhinitis - Continue with the Flonase 2 sprays per nostril daily. - Continue with Zyrtec (cetirizine) 10mg  daily every day.  - You can take an extra dose if needed on bad days.  3. Adverse food reaction - Avoid all of your triggering foods. - Be sure to pick up your EpiPen that has been prescribed.   4. Return in about 4 months (around 04/16/2019). This can be an in-person, a virtual Webex or a telephone follow up visit.   Please inform us of any Emergency Department visits, hospitalizations, or changes in symptoms. Call us before going to the ED for breathing or allergy symptoms since we might be able to fit you in for a sick visit. Feel free to contact us anytime with any questions, problems, or concerns.  It was a pleasure to meet you today!  Websites that have reliable patient information: 1. American Academy of Asthma, Allergy, and  Immunology: www.aaaai.org 2. Food Allergy Research and Education (FARE): foodallergy.org 3. Mothers of Asthmatics: http://www.asthmacommunitynetwork.org 4. American College of Allergy, Asthma, and Immunology: www.acaai.org  "Like" [name] on Facebook and Instagram for our latest updates!      Make sure you are registered to vote! If you have moved or changed any of your contact information, you will need to get this updated before voting!    Voter ID laws are NOT going into effect for the General Election in November 2020! DO NOT let this stop you from exercising your right to vote!

## 2018-12-15 NOTE — Addendum Note (Signed)
Addended by: Mliss Fritz I on: 12/15/2018 03:52 PM   Modules accepted: Orders

## 2018-12-15 NOTE — Progress Notes (Signed)
NEW PATIENT  Date of Service/Encounter:  12/15/18  Referring provider: Sharlene Dory, DO   Assessment:   Moderate persistent asthma - complicated by living situation  Chronic rhinitis   Adverse food reaction (allergies versus intolerances to multiple foods)  Uninsured status   Plan/Recommendations:   1. Moderate persistent asthma, uncomplicated - Lung testing actually looks good today.  - We are going to change you to Symbicort instead of Advair since you felt it worked better.  - Fill out the [place] and Me form to try to get the Symbicort for free. - In the interim, we are going provide you with some samples.  - We are going to get some blood work in case we need to start an injectable medication for your asthma (Xolair, [place] Ch[place]enger, or [place]).  - Spacer sample and demonstration provided. - Daily controller medication(s): Symbicort 160/4.61mcg two puffs twice daily with spacer - Prior to physical activity: albuterol 2 puffs 10-15 minutes before physical activity. - Rescue medications: albuterol 4 puffs every 4-6 hours as needed - Asthma control goals:  * Full participation in all desired activities (may need albuterol before activity) * Albuterol use two time or less a week on average (not counting use with activity) * Cough interfering with sleep two time or less a month * Oral steroids no more than once a year * No hospitalizations  2. Chronic rhinitis - Continue with the Flonase 2 sprays per nostril daily. - Continue with Zyrtec (cetirizine)  daily every day.  - You can take an extra dose if needed on bad days.  3. Adverse food reaction - Avoid all of your triggering foods. - Be sure to pick up your EpiPen that has been prescribed.   4. Return in about 4 months (around 04/16/2019). This can be an in-person, a virtual Webex or a telephone follow up visit.   Subjective:   Francisco Morrison is a 53 y.o. male presenting today for evaluation of  Chief Complaint    Patient presents with   Asthma   Allergies    Lasalle Abee has a history of the following: Patient Active Problem List   Diagnosis Date Noted   Moderate persistent asthma without complication 07/20/2018   Situational anxiety 08/30/2017   Visit for preventive health examination 11/10/2015   Prostate cancer screening 11/10/2015   Anxiety and depression 07/10/2015   Idiopathic neuropathy 01/11/2015   Gastroesophageal reflux disease without esophagitis 05/13/2014   Hypovitaminosis D 05/13/2014   Anxiety state, unspecified 05/13/2014    History obtained from: chart review and patient.  Francisco Morrison was referred by Sharlene Dory, DO.     Francisco Morrison is a 53 y.o. male presenting for an evaluation of a multitude of atopic complaints.  He is currently uninsured right now. He was working with [employer].   He reports that his allergic reactions started when he moved into the apartment with black mold. He moved in the middle of December and then moved out at the end of December. Apparently he changed into a shirt after running and this shirt was in the previous apartment with the black mold. He developed an intense dermatitis after putting it on. This happened three times in total and then he started putting together that he was wearing shirts that were stored in the apartment. He ended up throwing away the shirts that were stored in the previous apartment. He felt that this was easier than washing them. His last reaction was approximately the first week of  February. He does have a lot of mucous production during these reactions as well.      Asthma/Respiratory Symptom History: He reports that his asthma has been fairly well controlled until he moved to [place] from [place] in July. He then moved to [place] in November. He is in his 4th apartment since moving [place] in November. He felt that drug and cigarette exposure was in his apartment from nearby apartment. He had  mold in the second one. He had dust and mold in the third one. He is now in his 4th apartment because he smells marajuana fumes from his neighbors. He reports thta his throat becomes irritated nad he develops a burning sensation in his esophagus. He remains on Advair which he has been on for nearly 20 years. He was on Symbicort over the summer, which he felt worked well. But he is no longer insured, therefore it was very expensive. He is on the Advair 250/50 one puff twice daily. The rescue inhaler does help him to stay normal. Overall he does well but is unable to avoid his triggers.  He has never had any kind of injectable medication for his asthma.  Allergic Rhinitis Symptom History: He was on Nasonex for years. He is on Flonase now. The last time he was tested was 1994. He thinks that the allergies are fairly well controlled until the end of February.  He is not interested in retesting for environmental allergies, as his main complaint is his breathing.  Food Allergy Symptom History: He is "allergic" to yeast, eggs, milk, soy, nuts, shellfish, and wheat. This initial testing was done in 2006. He also has a sensitivity of potatoes which resulted in his throat turning red and he started itching all over (2014). He has not tried sweet potatoes because of the severity of the reaction.  He does have an EpiPen prescribed by his primary care provider.  Otherwise, there is no history of other atopic diseases, including food allergies, drug allergies, stinging insect allergies, eczema, urticaria or contact dermatitis. There is no significant infectious history. \Vaccinations are up to date.    Past Medical History: Patient Active Problem List   Diagnosis Date Noted   Moderate persistent asthma without complication 07/20/2018   Situational anxiety 08/30/2017   Visit for preventive health examination 11/10/2015   Prostate cancer screening 11/10/2015   Anxiety and depression 07/10/2015   Idiopathic  neuropathy 01/11/2015   Gastroesophageal reflux disease without esophagitis 05/13/2014   Hypovitaminosis D 05/13/2014   Anxiety state, unspecified 05/13/2014    Medication List:  Allergies as of 12/15/2018      Reactions   Dust Mite Extract    Other    Grass, Mold, Mildew   Shellfish Allergy    Wheat Bran Other (See Comments)   All Wheat Products   Eggs Or Egg-derived Products Other (See Comments)   Sensitivity      Medication List       Accurate as of December 15, 2018  3:32 PM. Always use your most recent med list.        albuterol 108 (90 Base) MCG/ACT inhaler Commonly known as:  Ventolin HFA Inhale 1-2 puffs into the lungs every 4 (four) hours as needed for wheezing or shortness of breath (MAXIMUM OF 12 PUFFS PER DAY).   fluticasone 50 MCG/ACT nasal spray Commonly known as:  FLONASE SHAKE LIQUID AND USE 2 SPRAYS IN EACH NOSTRIL DAILY AS NEEDED   Fluticasone-Salmeterol 250-50 MCG/DOSE Aepb Commonly known as:  ADVAIR  Inhale 1 puff into the lungs 2 (two) times daily.   hydrOXYzine 25 MG tablet Commonly known as:  ATARAX/VISTARIL Take 1 tablet (25 mg total) by mouth 3 (three) times daily as needed for anxiety.       Birth History: non-contributory  Developmental History: non-contributory  Past Surgical History: Past Surgical History:  Procedure Laterality Date   TOOTH EXTRACTION     WISDOM TOOTH EXTRACTION       Family History: Family History  Problem Relation Age of Onset   Diabetes Mother    Hypertension Father    Heart disease Father    Diabetes Brother    Diabetes Maternal Grandmother    Cancer Paternal Grandfather        Brain Tumor   Colon cancer Neg Hx      Social History: Akim lives at home by himself.  He is currently live in an apartment that is 53 years old.  There is plank flooring in the main living areas and carpeting in the bedrooms.  He has electric heating and central cooling.  There are no animals inside or outside of  the home.  He does have dust mite covers on his bedding.  He does not smoke himself, but he does smell his neighbors marijuana.  There is no smoking in the car.  He is currently unemployed.   Review of Systems  Constitutional: Negative.  Negative for chills, fever, malaise/fatigue and weight loss.  HENT: Negative.  Negative for congestion, ear discharge, ear pain, sinus pain and sore throat.   Eyes: Negative for pain, discharge and redness.  Respiratory: Positive for cough and shortness of breath. Negative for sputum production and wheezing.   Cardiovascular: Negative.  Negative for chest pain and palpitations.  Gastrointestinal: Negative for abdominal pain, heartburn, nausea and vomiting.  Skin: Positive for rash. Negative for itching.  Neurological: Negative for dizziness and headaches.  Endo/Heme/Allergies: Negative for environmental allergies. Does not bruise/bleed easily.       Objective:   Blood pressure 122/74, pulse 76, temperature 98.1 F (36.7 C), resp. rate 16, height 6' (1.829 m), weight 229 lb (103.9 kg), SpO2 95 %. Body mass index is 31.06 kg/m.   Physical Exam:   Physical Exam  Constitutional: He appears well-developed.  HENT:  Head: Normocephalic and atraumatic.  Right Ear: Tympanic membrane, external ear and ear canal normal. No drainage, swelling or tenderness. Tympanic membrane is not injected, not scarred, not erythematous, not retracted and not bulging.  Left Ear: Tympanic membrane, external ear and ear canal normal. No drainage, swelling or tenderness. Tympanic membrane is not injected, not scarred, not erythematous, not retracted and not bulging.  Nose: No mucosal edema, rhinorrhea, nasal deformity or septal deviation. No epistaxis. Right sinus exhibits no maxillary sinus tenderness and no frontal sinus tenderness. Left sinus exhibits no maxillary sinus tenderness and no frontal sinus tenderness.  Mouth/Throat: Uvula is midline and oropharynx is clear and  moist. Mucous membranes are not pale and not dry.  Eyes: Pupils are equal, round, and reactive to light. Conjunctivae and EOM are normal. Right eye exhibits no chemosis and no discharge. Left eye exhibits no chemosis and no discharge. Right conjunctiva is not injected. Left conjunctiva is not injected.  Cardiovascular: Normal rate, regular rhythm and normal heart sounds.  Respiratory: Effort normal and breath sounds normal. No accessory muscle usage. No tachypnea. No respiratory distress. He has no wheezes. He has no rhonchi. He has no rales. He exhibits no tenderness.  GI: There is  no abdominal tenderness. There is no rebound and no guarding.  Lymphadenopathy:       Head (right side): No submandibular, no tonsillar and no occipital adenopathy present.       Head (left side): No submandibular, no tonsillar and no occipital adenopathy present.    He has no cervical adenopathy.  Neurological: He is alert.  Skin: No abrasion, no petechiae and no rash noted. Rash is not papular, not vesicular and not urticarial. No erythema. No pallor.  Psychiatric: He has a normal mood and affect.     Diagnostic studies:    Spirometry: results normal (FEV1: 3.32/81%, FVC: 3.81/71%, FEV1/FVC: 87%).    Spirometry consistent with normal pattern.   Allergy Studies: none         Malachi BondsJoel Jaimarie Rapozo, MD [facility]

## 2018-12-17 LAB — CBC WITH DIFFERENTIAL/PLATELET
Basophils Absolute: 0 10*3/uL (ref 0.0–0.2)
Basos: 1 %
EOS (ABSOLUTE): 0.1 10*3/uL (ref 0.0–0.4)
Eos: 1 %
Hematocrit: 44.5 % (ref 37.5–51.0)
Hemoglobin: 15.5 g/dL (ref 13.0–17.7)
Immature Grans (Abs): 0 10*3/uL (ref 0.0–0.1)
Immature Granulocytes: 0 %
Lymphocytes Absolute: 1.1 10*3/uL (ref 0.7–3.1)
Lymphs: 19 %
MCH: 32.5 pg (ref 26.6–33.0)
MCHC: 34.8 g/dL (ref 31.5–35.7)
MCV: 93 fL (ref 79–97)
Monocytes Absolute: 0.4 10*3/uL (ref 0.1–0.9)
Monocytes: 8 %
Neutrophils Absolute: 4 10*3/uL (ref 1.4–7.0)
Neutrophils: 71 %
Platelets: 197 10*3/uL (ref 150–450)
RBC: 4.77 x10E6/uL (ref 4.14–5.80)
RDW: 12.3 % (ref 11.6–15.4)
WBC: 5.6 10*3/uL (ref 3.4–10.8)

## 2018-12-17 LAB — IGE+ALLERGENS ZONE 2(30)
Alternaria Alternata IgE: <0.1 kU/L
Amer Sycamore IgE Qn: <0.1 kU/L
Aspergillus Fumigatus IgE: <0.1 kU/L
Bahia Grass IgE: 2.67 kU/L — AB
Bermuda Grass IgE: 1.82 kU/L — AB
Cat Dander IgE: <0.1 kU/L
Cedar, Mountain IgE: 0.12 kU/L — AB
Cladosporium Herbarum IgE: <0.1 kU/L
Cockroach, American IgE: 0.18 kU/L — AB
Common Silver Birch IgE: <0.1 kU/L
D Farinae IgE: 0.38 kU/L — AB
D Pteronyssinus IgE: 0.32 kU/L — AB
Dog Dander IgE: <0.1 kU/L
Elm, American IgE: <0.1 kU/L
Hickory, White IgE: <0.1 kU/L
IgE (Immunoglobulin E), Serum: 146 IU/mL (ref 6–495)
Johnson Grass IgE: 2.23 kU/L — AB
Maple/Box Elder IgE: 0.14 kU/L — AB
Mucor Racemosus IgE: <0.1 kU/L
Mugwort IgE Qn: <0.1 kU/L
Nettle IgE: <0.1 kU/L
Oak, White IgE: <0.1 kU/L
Penicillium Chrysogen IgE: <0.1 kU/L
Pigweed, Rough IgE: <0.1 kU/L
Plantain, English IgE: <0.1 kU/L
Ragweed, Short IgE: <0.1 kU/L
Sheep Sorrel IgE Qn: <0.1 kU/L
Stemphylium Herbarum IgE: <0.1 kU/L
Sweet gum IgE RAST Ql: <0.1 kU/L
Timothy Grass IgE: 4.26 kU/L — AB
White Mulberry IgE: <0.1 kU/L

## 2018-12-17 LAB — ALLERGEN PROFILE, MOLD
Aureobasidi Pullulans IgE: 0.14 kU/L — AB
Candida Albicans IgE: <0.1 kU/L
M009-IgE Fusarium proliferatum: <0.1 kU/L
M014-IgE Epicoccum purpur: <0.1 kU/L
Phoma Betae IgE: <0.1 kU/L
Setomelanomma Rostrat: <0.1 kU/L

## 2018-12-17 LAB — TRYPTASE: Tryptase: 4.3 ug/L (ref 2.2–13.2)

## 2019-01-05 ENCOUNTER — Telehealth: Payer: Self-pay | Admitting: Allergy & Immunology

## 2019-01-05 NOTE — Telephone Encounter (Signed)
Patient called and said he has not worked in 15 months and he had spoken to you about his recent visit and the bill he received for the services, and you were able to help with reducing the cost. He said he also received a bill from [facility] for about $1600 for blood work that was done for allergies. He called [facility] to see if they could help to reduce this bill. They told him that they would be glad to help, but they needed you to call and give the reasoning over the phone to them. [facility] [phone] He would like to know if you could do this for him.

## 2019-01-10 NOTE — Telephone Encounter (Signed)
Patient called back, but I didn't;t have a free nurse.   Please Advise

## 2019-01-10 NOTE — Telephone Encounter (Signed)
Spoke with [facility] and patient will have to pay the full amount due to not receiving the 50% discount at time of service. Patient was hoping the bill will be sent to [facility] as a sliding scale so that [facility] will get the bill but policy states patient are to be set up as self pay patient and were required to notify [facility] they were self-pay patient since his insurance plan ended 11/16/2018, so that they could receive a 50% discount on all labs. We are not responsible for any charges that occurred and patient will need to contact [facility] regarding payment plans.

## 2019-01-10 NOTE — Telephone Encounter (Signed)
Left message for patient to call back  

## 2019-01-10 NOTE — Telephone Encounter (Signed)
Spoke with patient and he stated he will contact [facility] since they advise him that we would be responsible for the bill. Patient is responsible and will discuss what options he may take.

## 2019-01-10 NOTE — Telephone Encounter (Signed)
Can someone talk to [facility] or call [facility] to see what we need to do to decrease his bill?  Do I need to talk directly to someone or can this be handled by one of my staff?  Malachi Bonds, MD [facility]

## 2019-01-11 NOTE — Telephone Encounter (Signed)
That is unfortunate.  Malachi Bonds, MD [facility]

## 2019-04-18 ENCOUNTER — Ambulatory Visit: Payer: Self-pay | Admitting: Allergy & Immunology

## 2019-05-25 ENCOUNTER — Ambulatory Visit (INDEPENDENT_AMBULATORY_CARE_PROVIDER_SITE_OTHER): Payer: No Typology Code available for payment source | Admitting: Internal Medicine

## 2019-05-25 ENCOUNTER — Encounter (INDEPENDENT_AMBULATORY_CARE_PROVIDER_SITE_OTHER): Payer: Self-pay | Admitting: Internal Medicine

## 2019-05-25 VITALS — BP 128/80 | HR 68 | Temp 98.2°F | Ht 71.0 in | Wt 228.0 lb

## 2019-05-25 DIAGNOSIS — M25552 Pain in left hip: Secondary | ICD-10-CM | POA: Insufficient documentation

## 2019-05-25 DIAGNOSIS — K222 Esophageal obstruction: Secondary | ICD-10-CM

## 2019-05-25 DIAGNOSIS — J4540 Moderate persistent asthma, uncomplicated: Secondary | ICD-10-CM

## 2019-05-25 DIAGNOSIS — Z91018 Allergy to other foods: Secondary | ICD-10-CM | POA: Insufficient documentation

## 2019-05-25 DIAGNOSIS — Z8659 Personal history of other mental and behavioral disorders: Secondary | ICD-10-CM

## 2019-05-25 MED ORDER — BUDESONIDE-FORMOTEROL FUMARATE 160-4.5 MCG/ACT IN AERO
2.00 | INHALATION_SPRAY | Freq: Two times a day (BID) | RESPIRATORY_TRACT | 3 refills | Status: AC
Start: 2019-05-25 — End: ?

## 2019-05-25 NOTE — Progress Notes (Signed)
[facility] [place] [facility]                  Date of Exam: 05/25/2019 7:56 AM        Patient ID: Jesse Berry is a 53 y.o. male.        Chief Complaint:    Chief Complaint   Patient presents with    Get established    Medication Refill     Symbicort    Discuss difficulty breathing & muscle soreness     x 2 days             HPI:    New patient here to establish care, moved from [place]    History of allergies to many environmental triggers as well as many foods. Has asthma symptoms which are flared up with allergen exposures. He is quite careful with his diet. Feels asthma symptoms are controlled with his current dose Symbicort. He does use albuterol daily but more so to prep for exercise and not because he is experiencing any acute shortness of breath or wheezing.    He has had several leg injuries in the past. Peroneal nerve injury, torn gastroc. Occasional left hip pain after a groin pull in Feb 2020, but not stopping him from exercising. Stays active with walking and running            Problem List:    Patient Active Problem List   Diagnosis    Moderate persistent asthma    Multiple food allergies    Schatzki's ring of distal esophagus    History of anxiety    Left hip pain             Current Meds:    Outpatient Medications Marked as Taking for the 05/25/19 encounter (Office Visit) with Lurlean Horns, MD   Medication Sig Dispense Refill    albuterol sulfate HFA (PROVENTIL) 108 (90 Base) MCG/ACT inhaler Inhale 2 puffs into the lungs as needed      budesonide-formoterol (SYMBICORT) 160-4.5 MCG/ACT inhaler Inhale 2 puffs into the lungs 2 (two) times daily 3 Inhaler 3    cetirizine (ZyrTEC) 10 MG tablet Take 10 mg by mouth daily      EPINEPHrine (EPIPEN 2-PAK) 0.3 MG/0.3ML Solution Auto-injector injection Inject 0.3 mg into the muscle once as needed      fluticasone (FLONASE) 50 MCG/ACT nasal spray 1 spray by Nasal route daily      [DISCONTINUED] budesonide-formoterol  (SYMBICORT) 160-4.5 MCG/ACT inhaler Inhale 2 puffs into the lungs 2 (two) times daily            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           Family History:    Family History   Problem Relation Age of Onset    Diabetes Mother     Anxiety disorder Father     Arrhythmia Father     Diabetes Brother     Diabetes Maternal Grandmother            Social History:    Social History     Tobacco Use    Smoking status: Never Smoker    Smokeless tobacco: Never Used  Substance Use Topics    Alcohol use: Never     Frequency: Never           The following sections were reviewed this encounter by the provider:   Tobacco   Allergies   Meds   Problems   Med Hx   Surg Hx   Fam Hx              Vital Signs:    Vitals:    05/25/19 1542   BP: 128/80   BP Site: Left arm   Pulse: 68   Temp: 98.2 F (36.8 C)   TempSrc: Temporal   SpO2: 96%   Weight: 103.4 kg (228 lb)   Height: 1.803 m (5\' 11" )            ROS:    Review of Systems   Constitutional: Negative for appetite change, chills, fatigue, fever and unexpected weight change.   HENT: Negative for congestion, ear pain and postnasal drip.    Eyes: Negative for visual disturbance.   Respiratory: Negative for cough, chest tightness and shortness of breath.    Cardiovascular: Negative for chest pain, palpitations and leg swelling.   Gastrointestinal: Negative for abdominal pain, blood in stool, constipation and diarrhea.   Endocrine: Negative for cold intolerance, heat intolerance, polydipsia and polyuria.   Genitourinary: Negative for frequency and urgency.   Musculoskeletal: Negative for arthralgias and myalgias.   Skin: Negative for rash and wound.   Allergic/Immunologic: Positive for environmental allergies and food allergies. Negative for immunocompromised state.   Neurological:  Negative for dizziness and headaches.   Hematological: Negative for adenopathy. Does not bruise/bleed easily.   Psychiatric/Behavioral: Negative for dysphoric mood. The patient is not nervous/anxious.               Physical Exam:    Physical Exam  Vitals signs reviewed.   Constitutional:       General: He is not in acute distress.     Appearance: Normal appearance.   HENT:      Head: Normocephalic and atraumatic.   Eyes:      Conjunctiva/sclera: Conjunctivae normal.      Pupils: Pupils are equal, round, and reactive to light.   Cardiovascular:      Rate and Rhythm: Normal rate and regular rhythm.      Pulses: Normal pulses.      Heart sounds: Normal heart sounds. No murmur. No gallop.    Pulmonary:      Effort: Pulmonary effort is normal. No respiratory distress.      Breath sounds: Normal breath sounds.   Musculoskeletal:      Right lower leg: No edema.      Left lower leg: No edema.   Skin:     Findings: No rash.   Neurological:      General: No focal deficit present.      Mental Status: He is alert.      Gait: Gait normal.   Psychiatric:         Mood and Affect: Mood normal.         Behavior: Behavior normal.              Assessment/Plan:    Problem List Items Addressed This Visit        Respiratory    Moderate persistent asthma     Patient feels symptoms are controlled with current regimen on symbicort BID. Uses albuterol daily to prep for exercise. Saw an allergist in the  past but feels it is not necessary at this time         Relevant Medications    albuterol sulfate HFA (PROVENTIL) 108 (90 Base) MCG/ACT inhaler    budesonide-formoterol (SYMBICORT) 160-4.5 MCG/ACT inhaler       Digestive    Schatzki's ring of distal esophagus     Had EGD with dilation 2016  No current symptoms, no GERD            Other    Multiple food allergies    History of anxiety     Feels he has "overcome" his anxiety and no need for further treatment         Left hip pain     Running related injury, occasional flares of pain and muscle  tension around the hip. Interested in PT         Relevant Orders    Ambulatory referral to Physical Therapy                  Follow-up:    Return in about 6 months (around 11/23/2019) for physical.         Lurlean Horns, MD

## 2019-05-25 NOTE — Progress Notes (Signed)
-   Are you currently experiencing fever, cough, or shortness of breath? :: YES (difficulty breathing for 2 days)    - Are you currently experiencing chills, sore throat, new headache, loss of taste/smell, or body aches not attributable to physical activity, injury, or pre-existing conditions? :: YES (muscle soreness x 2 days)    - Have you had prolonged close contact with a known COVID-19 patient? :: NO    - Have you recently been tested for COVID-19? :: NO    - Have you been diagnosed with COVID-19? :: NO    - Do you live in a group living residence, such as an assisted living facility, nursing home, shelter, or dormitory? :: NO    - Do you work or Agricultural consultant in a healthcare setting? :: NO

## 2019-05-26 ENCOUNTER — Encounter (INDEPENDENT_AMBULATORY_CARE_PROVIDER_SITE_OTHER): Payer: Self-pay | Admitting: Internal Medicine

## 2019-05-26 NOTE — Assessment & Plan Note (Signed)
Patient feels symptoms are controlled with current regimen on symbicort BID. Uses albuterol daily to prep for exercise. Saw an allergist in the past but feels it is not necessary at this time

## 2019-05-26 NOTE — Assessment & Plan Note (Signed)
Running related injury, occasional flares of pain and muscle tension around the hip. Interested in PT

## 2019-05-26 NOTE — Assessment & Plan Note (Signed)
Feels he has "overcome" his anxiety and no need for further treatment

## 2019-05-26 NOTE — Assessment & Plan Note (Signed)
Had EGD with dilation 2016  No current symptoms, no GERD

## 2019-07-24 ENCOUNTER — Encounter (INDEPENDENT_AMBULATORY_CARE_PROVIDER_SITE_OTHER): Payer: Self-pay | Admitting: Physician Assistant

## 2019-07-24 ENCOUNTER — Ambulatory Visit (INDEPENDENT_AMBULATORY_CARE_PROVIDER_SITE_OTHER): Payer: No Typology Code available for payment source | Admitting: Physician Assistant

## 2019-07-24 VITALS — BP 100/70 | HR 87 | Temp 97.1°F | Ht 72.0 in | Wt 228.0 lb

## 2019-07-24 DIAGNOSIS — S52125A Nondisplaced fracture of head of left radius, initial encounter for closed fracture: Secondary | ICD-10-CM

## 2019-07-24 DIAGNOSIS — R0781 Pleurodynia: Secondary | ICD-10-CM

## 2019-07-24 DIAGNOSIS — M79632 Pain in left forearm: Secondary | ICD-10-CM

## 2019-07-24 DIAGNOSIS — M25522 Pain in left elbow: Secondary | ICD-10-CM

## 2019-07-24 MED ORDER — OXYCODONE-ACETAMINOPHEN 5-325 MG PO TABS
1.00 | ORAL_TABLET | Freq: Four times a day (QID) | ORAL | 0 refills | Status: AC | PRN
Start: 2019-07-24 — End: 2019-07-29

## 2019-07-24 NOTE — Progress Notes (Addendum)
[facility] INTERNAL MEDICINE - AN [place] [facility]                  Date of Exam: 07/24/2019 9:13 PM        Patient ID: Jesse Berry is a 53 y.o. male.        Chief Complaint:    Chief Complaint   Patient presents with    left arm pain     injury x 2 days    right chest pain             HPI:    Larey Seat while running yesterday. Denied head injury/LOC. Resulted in abrasions on palmar of b/l hands,rt ribs, left elbow and left forearm pain. Rt ribs pain aggravated w/ sneezing/coughing/taking deep breathes.     Fall  Incident onset: yesterday. The fall occurred while running. He fell from a height of 3 to 5 ft. He landed on concrete. There was no blood loss. The point of impact was the left elbow. The pain is present in the left lower arm. The pain is moderate. The symptoms are aggravated by rotation and extension. Pertinent negatives include no abdominal pain, fever, headaches, loss of consciousness, nausea, numbness, tingling or vomiting. He has tried nothing for the symptoms.             Problem List:    Patient Active Problem List   Diagnosis    Moderate persistent asthma    Multiple food allergies    Schatzki's ring of distal esophagus    History of anxiety    Left hip pain             Current Meds:    Current Outpatient Medications   Medication Sig Dispense Refill    albuterol sulfate HFA (PROVENTIL) 108 (90 Base) MCG/ACT inhaler Inhale 2 puffs into the lungs as needed      budesonide-formoterol (SYMBICORT) 160-4.5 MCG/ACT inhaler Inhale 2 puffs into the lungs 2 (two) times daily 3 Inhaler 3    cetirizine (ZyrTEC) 10 MG tablet Take 10 mg by mouth daily      EPINEPHrine (EPIPEN 2-PAK) 0.3 MG/0.3ML Solution Auto-injector injection Inject 0.3 mg into the muscle once as needed      fluticasone (FLONASE) 50 MCG/ACT nasal spray 1 spray by Nasal route daily      oxyCODONE-acetaminophen (PERCOCET) 5-325 MG per tablet Take 1 tablet by mouth every 6 (six) hours as needed for Pain 20 tablet 0     No current  facility-administered medications for this visit.            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           Family History:    Family History   Problem Relation Age of Onset    Diabetes Mother     Anxiety disorder Father     Arrhythmia Father     Diabetes Brother     Diabetes Maternal Grandmother            Social History:    Social History     Socioeconomic History    Marital status: Single     Spouse name: None    Number of children: None  Years of education: None    Highest education level: None   Occupational History    Occupation: Chartered loss adjuster strain: None    Food insecurity     Worry: None     Inability: None    Transportation needs     Medical: None     Non-medical: None   Tobacco Use    Smoking status: Never Smoker    Smokeless tobacco: Never Used   Substance and Sexual Activity    Alcohol use: Never     Frequency: Never    Drug use: Never    Sexual activity: None   Lifestyle    Physical activity     Days per week: None     Minutes per session: None    Stress: None   Relationships    Social connections     Talks on phone: None     Gets together: None     Attends religious service: None     Active member of club or organization: None     Attends meetings of clubs or organizations: None     Relationship status: None    Intimate partner violence     Fear of current or ex partner: None     Emotionally abused: None     Physically abused: None     Forced sexual activity: None   Other Topics Concern    None   Social History Narrative    None           The following sections were reviewed this encounter by the provider:   Tobacco   Allergies   Meds   Problems   Med Hx   Surg Hx   Fam Hx              Vital Signs:    Vitals:    07/24/19 1233   BP: 100/70   Pulse: 87    Temp: 97.1 F (36.2 C)   SpO2: 97%   Weight: 103.4 kg (228 lb)   Height: 1.829 m (6')            ROS:    Review of Systems   Constitutional: Negative for fever.   Eyes: Negative for visual disturbance.   Respiratory: Negative for cough and shortness of breath.    Gastrointestinal: Negative for abdominal pain, nausea and vomiting.   Musculoskeletal: Negative for joint swelling and neck pain.   Skin:        See HPI     Neurological: Negative for tingling, loss of consciousness, weakness, numbness and headaches.              Physical Exam:    Physical Exam  Constitutional:       Appearance: Normal appearance.   Eyes:      Extraocular Movements: Extraocular movements intact.      Pupils: Pupils are equal, round, and reactive to light.   Neck:      Musculoskeletal: Normal range of motion.   Cardiovascular:      Rate and Rhythm: Normal rate and regular rhythm.   Pulmonary:      Effort: Pulmonary effort is normal.      Breath sounds: Normal breath sounds.   Musculoskeletal:      Left shoulder: He exhibits normal range of motion, no tenderness, no bony tenderness and no swelling.      Left elbow: He exhibits no swelling, no deformity and no laceration. Tenderness  found. Radial head tenderness noted.      Left wrist: He exhibits no tenderness, no crepitus and no deformity.      Left hand: He exhibits normal range of motion, no tenderness, no deformity and no laceration.      Comments: Rt 5th and 6th ribs ttp. No deformity.   No snuffbox ttp.    Skin:     Comments: 1 superficial abrasion on left and right hand (palmar aspect). No ttp/bleeding. No ecchymosis.    Neurological:      General: No focal deficit present.      Mental Status: He is alert and oriented to person, place, and time.              Assessment/Plan:    Problem List Items Addressed This Visit     None      Visit Diagnoses     Left forearm pain    -  Primary  S/p fall against concrete while running yesterday. No associated head injury. Most pain over left elbow  and proximal left forearm. Aggravated to touch and w/ certain movement. No paresthesia/weakness. Xrays suggested left radial head fx, minimally displaced.     Referred pt to ortho. Rec Motrin 600 mg bid w/ meals x few days to a week to help w/ inflammation and sling until f/u w/ ortho tomorrow am at 845 am.     Relevant Orders    XR Forearm Complete Left    Left elbow pain      Same as above    Relevant Orders    XR Elbow Left AP And Lateral    Rib pain on right side      Resulted from injury yesterday. Rt 5th and 6th ribs ttp on exam. Lung fields CTA. Rt ribs xray w/ CXR neg for fx. Likely contusion.    Rec Motrin 600 mg bid w/ meals x few days to 1 week. F/u prn pain worsen > 1 wk.     Relevant Orders    XR Ribs Right W PA Chest    Closed nondisplaced fracture of head of left radius, initial encounter     Rec sling until f/u w/ ortho on 07/25/19 at 845 am. Pt has xray films on CD when f/u w/ ortho.     Relevant Medications    oxyCODONE-acetaminophen (PERCOCET) 5-325 MG per tablet                  Follow-up:    Return if symptoms worsen or fail to improve.         Anh-Phuong T Shea Swalley, PA       I have reviewed the history and physical note and findings  Lurlean Horns, MD

## 2019-07-25 ENCOUNTER — Encounter (INDEPENDENT_AMBULATORY_CARE_PROVIDER_SITE_OTHER): Payer: Self-pay | Admitting: Physician Assistant

## 2019-08-27 ENCOUNTER — Other Ambulatory Visit: Payer: Self-pay | Admitting: Family Medicine

## 2019-08-27 DIAGNOSIS — J4540 Moderate persistent asthma, uncomplicated: Secondary | ICD-10-CM

## 2019-09-08 ENCOUNTER — Ambulatory Visit (INDEPENDENT_AMBULATORY_CARE_PROVIDER_SITE_OTHER): Payer: No Typology Code available for payment source | Admitting: Physician Assistant

## 2019-09-08 ENCOUNTER — Encounter (INDEPENDENT_AMBULATORY_CARE_PROVIDER_SITE_OTHER): Payer: Self-pay | Admitting: Physician Assistant

## 2019-09-08 VITALS — BP 120/80 | Temp 96.9°F | Ht 72.0 in | Wt 239.0 lb

## 2019-09-08 DIAGNOSIS — M5416 Radiculopathy, lumbar region: Secondary | ICD-10-CM

## 2019-09-08 MED ORDER — MELOXICAM 7.5 MG PO TABS
7.5000 mg | ORAL_TABLET | Freq: Every day | ORAL | 0 refills | Status: DC
Start: 2019-09-08 — End: 2019-10-23

## 2019-09-08 MED ORDER — CYCLOBENZAPRINE HCL 5 MG PO TABS
ORAL_TABLET | ORAL | 0 refills | Status: DC
Start: 2019-09-08 — End: 2019-10-23

## 2019-09-08 NOTE — Progress Notes (Addendum)
I have reviewed this note - the history, physical findings & data.  The management plan was formulated under my supervision.  Marney Setting MD.           [facility] INTERNAL MEDICINE - AN [place] [facility]                  Date of Exam: 09/08/2019 1:54 PM        Patient ID: Jesse Berry is a 54 y.o. male.        Chief Complaint:    Chief Complaint   Patient presents with    Left hip pain radiating     on & off x1y left big numb             HPI:    Severity of pain worse past 1 wk. Pain concentrated around left hip/groin. + Lt gluteal pain radiating to left hip/groin. Intermittent numbness sensation on left big toe. I.e. w/ 180 extension of left hip. Pain aggravated when lay on left side certain way. Stabbing pain w/ walking.     Prefer to avoid Prednisone b/c med makes pt agitated.       Hip Pain   Incident onset: Almost 1 year. Injury mechanism: Pulled muscle moving a mattress on 10/14/18. The pain is present in the left hip and left thigh. The quality of the pain is described as stabbing. The pain is moderate. The pain has been intermittent since onset. Associated symptoms include numbness (left big toe). Pertinent negatives include no inability to bear weight, loss of motion, loss of sensation or muscle weakness. The symptoms are aggravated by movement. He has tried NSAIDs for the symptoms. The treatment provided mild relief.             Problem List:    Patient Active Problem List   Diagnosis    Moderate persistent asthma    Multiple food allergies    Schatzki's ring of distal esophagus    History of anxiety    Left hip pain             Current Meds:    Current Outpatient Medications   Medication Sig Dispense Refill    albuterol sulfate HFA (PROVENTIL) 108 (90 Base) MCG/ACT inhaler Inhale 2 puffs into the lungs as needed      budesonide-formoterol (SYMBICORT) 160-4.5 MCG/ACT inhaler Inhale 2 puffs into the lungs 2 (two) times daily 3 Inhaler 3    cetirizine (ZyrTEC) 10 MG tablet Take 10 mg by mouth daily       EPINEPHrine (EPIPEN 2-PAK) 0.3 MG/0.3ML Solution Auto-injector injection Inject 0.3 mg into the muscle once as needed      fluticasone (FLONASE) 50 MCG/ACT nasal spray 1 spray by Nasal route daily      cyclobenzaprine (FLEXERIL) 5 MG tablet 1-2 tabs po q8h prn muscle spasm 30 tablet 0    meloxicam (Mobic) 7.5 MG tablet Take 1 tablet (7.5 mg total) by mouth daily with lunch 14 tablet 0     No current facility-administered medications for this visit.            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           Family  History:    Family History   Problem Relation Age of Onset    Diabetes Mother     Anxiety disorder Father     Arrhythmia Father     Diabetes Brother     Diabetes Maternal Grandmother            Social History:    Social History     Socioeconomic History    Marital status: Single     Spouse name: None    Number of children: None    Years of education: None    Highest education level: None   Occupational History    Occupation: Chartered loss adjuster strain: None    Food insecurity     Worry: None     Inability: None    Transportation needs     Medical: None     Non-medical: None   Tobacco Use    Smoking status: Never Smoker    Smokeless tobacco: Never Used   Substance and Sexual Activity    Alcohol use: Never     Frequency: Never    Drug use: Never    Sexual activity: None   Lifestyle    Physical activity     Days per week: None     Minutes per session: None    Stress: None   Relationships    Social connections     Talks on phone: None     Gets together: None     Attends religious service: None     Active member of club or organization: None     Attends meetings of clubs or organizations: None     Relationship status: None    Intimate partner violence     Fear of current  or ex partner: None     Emotionally abused: None     Physically abused: None     Forced sexual activity: None   Other Topics Concern    None   Social History Narrative    None           The following sections were reviewed this encounter by the provider:   Tobacco   Allergies   Meds   Problems   Med Hx   Surg Hx   Fam Hx              Vital Signs:    Vitals:    09/08/19 1159   BP: 120/80   BP Site: Right arm   Patient Position: Sitting   Temp: (!) 96.9 F (36.1 C)   TempSrc: Temporal   Weight: 108.4 kg (239 lb)   Height: 1.829 m (6')            ROS:    Review of Systems   Constitutional: Negative for fever.   Gastrointestinal: Negative for abdominal pain, constipation, nausea and vomiting.   Genitourinary: Negative for difficulty urinating and penile pain.   Musculoskeletal: Negative for joint swelling.   Skin: Negative for rash.   Neurological: Positive for numbness (left big toe).              Physical Exam:    Physical Exam  Constitutional:       Appearance: Normal appearance.   Neck:      Musculoskeletal: Normal range of motion.   Pulmonary:      Effort: Pulmonary effort is normal.   Abdominal:      Hernia: No hernia is present.   Musculoskeletal:  Left hip: He exhibits normal range of motion, no tenderness, no swelling and no crepitus.      Lumbar back: He exhibits normal range of motion, no tenderness and no spasm.   Skin:     Findings: No rash.   Neurological:      General: No focal deficit present.      Mental Status: He is alert and oriented to person, place, and time.      Gait: Gait normal.              Assessment/Plan:    Problem List Items Addressed This Visit     None      Visit Diagnoses     Lumbar back pain with radiculopathy affecting left lower extremity    -  Primary  Almost 1 year hx of left gluteal pain radiating to left hip and left thigh. W/ intermittent left big toe numbness sensation. No focal neuro deficit.    Referring pt for MRI L-spine to eval for spinal pathology such as herniated  disc/spinal stenosis. Pt wants to avoid taking Prednisone given s/e of agitation.     Relevant Medications    cyclobenzaprine (FLEXERIL) 5 MG tablet, 1-2 tabs po q8h prn muscle spasm, disp #30, no refill    meloxicam (Mobic) 7.5 MG tablet, 1 tab po qd w/ meals, disp #14, no refill    Other Relevant Orders    MRI Lumbar Spine W WO Contrast                  Follow-up:    Return if symptoms worsen or fail to improve.         Anh-Phuong Colen Darling, PA

## 2019-09-08 NOTE — Progress Notes (Signed)
No recent travels, no SOB, no cough, no fever, no known Covid exposure, no Covid testing no recent visits to nursing homes.

## 2019-09-14 ENCOUNTER — Ambulatory Visit
Admission: RE | Admit: 2019-09-14 | Discharge: 2019-09-14 | Disposition: A | Discharge status: Home / Self Care | Payer: No Typology Code available for payment source | Source: Ambulatory Visit | Attending: Physician Assistant | Admitting: Physician Assistant

## 2019-09-14 DIAGNOSIS — M5416 Radiculopathy, lumbar region: Secondary | ICD-10-CM | POA: Insufficient documentation

## 2019-09-14 MED ORDER — GADOBUTROL 1 MMOL/ML IV SOLN
10.00 mL | Freq: Once | INTRAVENOUS | Status: AC | PRN
Start: 2019-09-14 — End: 2019-09-14
  Administered 2019-09-14: 10 mmol via INTRAVENOUS
  Filled 2019-09-14: qty 10

## 2019-09-15 ENCOUNTER — Telehealth (INDEPENDENT_AMBULATORY_CARE_PROVIDER_SITE_OTHER): Payer: Self-pay | Admitting: Physician Assistant

## 2019-09-15 DIAGNOSIS — M5416 Radiculopathy, lumbar region: Secondary | ICD-10-CM

## 2019-09-15 NOTE — Telephone Encounter (Signed)
09/15/19 pm: Informed pt of MRI L-spine results. Suggested degenerative changes at multi L levels and disc bugling. Referred pt to see ortho, Dr. Rachael Fee. Pt plans to call today for eval.

## 2019-10-02 ENCOUNTER — Other Ambulatory Visit (INDEPENDENT_AMBULATORY_CARE_PROVIDER_SITE_OTHER): Payer: Self-pay | Admitting: Physician Assistant

## 2019-10-02 DIAGNOSIS — M5416 Radiculopathy, lumbar region: Secondary | ICD-10-CM

## 2019-10-09 ENCOUNTER — Telehealth: Payer: No Typology Code available for payment source

## 2019-10-23 ENCOUNTER — Encounter (INDEPENDENT_AMBULATORY_CARE_PROVIDER_SITE_OTHER): Payer: Self-pay | Admitting: Physician Assistant

## 2019-10-23 ENCOUNTER — Ambulatory Visit (INDEPENDENT_AMBULATORY_CARE_PROVIDER_SITE_OTHER): Payer: No Typology Code available for payment source | Admitting: Physician Assistant

## 2019-10-23 VITALS — BP 130/80 | HR 98 | Temp 97.2°F

## 2019-10-23 DIAGNOSIS — M1612 Unilateral primary osteoarthritis, left hip: Secondary | ICD-10-CM

## 2019-10-23 DIAGNOSIS — R030 Elevated blood-pressure reading, without diagnosis of hypertension: Secondary | ICD-10-CM

## 2019-10-23 DIAGNOSIS — M5126 Other intervertebral disc displacement, lumbar region: Secondary | ICD-10-CM

## 2019-10-23 MED ORDER — MELOXICAM 7.5 MG PO TABS
7.50 mg | ORAL_TABLET | Freq: Every day | ORAL | 0 refills | Status: AC | PRN
Start: 2019-10-23 — End: ?

## 2019-10-23 MED ORDER — CYCLOBENZAPRINE HCL 10 MG PO TABS
10.00 mg | ORAL_TABLET | Freq: Three times a day (TID) | ORAL | 1 refills | Status: AC | PRN
Start: 2019-10-23 — End: 2019-11-12

## 2019-10-23 NOTE — Progress Notes (Addendum)
[facility] INTERNAL MEDICINE - AN  PARTNER                  Date of Exam: 10/23/2019 12:49 PM        Patient ID: Jesse Berry is a 54 y.o. male.        Chief Complaint:    Chief Complaint   Patient presents with    Spasms     x today, radiating pain from RLB to groin down to the R leg with numbness. No tingling sensation.              HPI:    Hx pulled muscle lower back x 2 wks ago. Heating sensation to rt anterior thigh and rt knee. Numbness sensation in rt anterior thigh that onset this am. Meloxicam 7.5 mg does help w/ pain. MRI l-spine done 09/14/19 suggested L2-L3 and L4-L5 disc herniation. Saw spinal ortho, Dr. Kizzie Bane, on 09/20/19. Also saw another ortho, Dr. Annie Sable, on 10/03/19, for eval of lt hip. Had xray left hip that suggested OA. Was advised to start PT and avoid running.       Back Pain  This is a chronic problem. The problem occurs constantly. The pain is present in the lumbar spine. The pain radiates to the right thigh. The pain is moderate. The symptoms are aggravated by bending and position. Associated symptoms include numbness (rt thigh x today). Pertinent negatives include no abdominal pain, bladder incontinence, bowel incontinence, chest pain, dysuria, fever, pelvic pain, perianal numbness, tingling or weakness. He has tried NSAIDs for the symptoms. The treatment provided moderate relief.             Problem List:    Patient Active Problem List   Diagnosis    Moderate persistent asthma    Multiple food allergies    Schatzki's ring of distal esophagus    History of anxiety    Left hip pain             Current Meds:    Current Outpatient Medications   Medication Sig Dispense Refill    albuterol sulfate HFA (PROVENTIL) 108 (90 Base) MCG/ACT inhaler Inhale 2 puffs into the lungs as needed      budesonide-formoterol (SYMBICORT) 160-4.5 MCG/ACT inhaler Inhale 2 puffs into the lungs 2 (two) times daily 3 Inhaler 3    cetirizine (ZyrTEC) 10 MG tablet Take 10 mg by mouth daily       cyclobenzaprine (FLEXERIL) 10 MG tablet Take 1 tablet (10 mg total) by mouth every 8 (eight) hours as needed for Muscle spasms 30 tablet 1    EPINEPHrine (EPIPEN 2-PAK) 0.3 MG/0.3ML Solution Auto-injector injection Inject 0.3 mg into the muscle once as needed      fluticasone (FLONASE) 50 MCG/ACT nasal spray 1 spray by Nasal route daily      meloxicam (Mobic) 7.5 MG tablet Take 1 tablet (7.5 mg total) by mouth daily as needed for Pain (Take with food) 30 tablet 0     No current facility-administered medications for this visit.            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           Family History:  Family History   Problem Relation Age of Onset    Diabetes Mother     Anxiety disorder Father     Arrhythmia Father     Diabetes Brother     Diabetes Maternal Grandmother            Social History:    Social History     Socioeconomic History    Marital status: Single     Spouse name: None    Number of children: None    Years of education: None    Highest education level: None   Occupational History    Occupation: Chartered loss adjuster strain: None    Food insecurity     Worry: None     Inability: None    Transportation needs     Medical: None     Non-medical: None   Tobacco Use    Smoking status: Never Smoker    Smokeless tobacco: Never Used   Substance and Sexual Activity    Alcohol use: Never     Frequency: Never    Drug use: Never    Sexual activity: None   Lifestyle    Physical activity     Days per week: None     Minutes per session: None    Stress: None   Relationships    Social connections     Talks on phone: None     Gets together: None     Attends religious service: None     Active member of club or organization: None     Attends meetings of clubs or organizations:  None     Relationship status: None    Intimate partner violence     Fear of current or ex partner: None     Emotionally abused: None     Physically abused: None     Forced sexual activity: None   Other Topics Concern    None   Social History Narrative    None           The following sections were reviewed this encounter by the provider:   Tobacco   Allergies   Meds   Problems   Med Hx   Surg Hx   Fam Hx              Vital Signs:    Vitals:    10/23/19 1116 10/23/19 1158 10/23/19 1247   BP: (!) 140/100 130/80 130/80   BP Site: Right arm     Pulse: 98     Temp: 97.2 F (36.2 C)     SpO2: 98%              ROS:    Review of Systems   Constitutional: Negative for chills, fatigue and fever.   Eyes: Negative for visual disturbance.   Respiratory: Negative for shortness of breath.    Cardiovascular: Negative for chest pain.   Gastrointestinal: Negative for abdominal pain, bowel incontinence, constipation, nausea and vomiting.   Genitourinary: Negative for bladder incontinence, difficulty urinating, dysuria and pelvic pain.   Musculoskeletal: Positive for back pain. Negative for joint swelling.   Skin: Negative for rash and wound.   Neurological: Positive for numbness (rt thigh x today). Negative for tingling and weakness.              Physical Exam:    Physical Exam  Constitutional:       Appearance: Normal appearance.   Cardiovascular:  Rate and Rhythm: Normal rate and regular rhythm.   Pulmonary:      Effort: Pulmonary effort is normal.      Breath sounds: Normal breath sounds.   Musculoskeletal:      Lumbar back: He exhibits spasm. He exhibits normal range of motion, no tenderness and no swelling.      Right upper leg: He exhibits no tenderness and no swelling.   Neurological:      General: No focal deficit present.      Mental Status: He is alert and oriented to person, place, and time.      Motor: Motor function is intact.      Deep Tendon Reflexes: Reflexes normal.              Assessment/Plan:    Problem List  Items Addressed This Visit     None      Visit Diagnoses     Lumbar disc herniation    -  Primary  Acute flare of LBP x 2 wks ago from pulled muscle -> numbness sensation on rt thigh x today. No red flags such as lower ext weakness/urination difficulty. MRI L-spine done 09/14/19. Pt had eval w/ ortho, Dr. Rachael Fee, on 09/20/19. Was referred PT and consideration of cortisone injection. No focal neuro deficit on exam.    Pt plans to sched PT today. Pt wants to avoid oral Prednisone due to s/e of "jittery". Meloxicam has been working well. Rec take Meloxicam 7.5 mg qd w/ meals for 1 wk then prn.     Relevant Medications    cyclobenzaprine (FLEXERIL) 10 MG tablet, 1 tab po q8h prn, disp #30, 1 refill    meloxicam (Mobic) 7.5 MG tablet, 1 tab po qd prn w/ meals, disp #30, no refill    Arthritis of left hip      Had ortho eval w/ Dr. Annie Sable. Plans to start PT.     Elevated blood pressure reading    Repeated bp acceptable. Likely 2/2 acute pain and anxiety.                     Follow-up:    Return if symptoms worsen or fail to improve.         Anh-Phuong T Jacelyn Cuen, PA       I have reviewed the history and physical note and findings  Lurlean Horns, MD

## 2019-10-23 NOTE — Progress Notes (Signed)
No recent travels, no cough, no fever, no SOB, no Covid testing, no known Covid exposure, no visit to a nursing homes.

## 2019-10-24 ENCOUNTER — Other Ambulatory Visit (INDEPENDENT_AMBULATORY_CARE_PROVIDER_SITE_OTHER): Payer: Self-pay | Admitting: Physician Assistant

## 2019-10-24 DIAGNOSIS — M1612 Unilateral primary osteoarthritis, left hip: Secondary | ICD-10-CM

## 2019-10-24 DIAGNOSIS — M5126 Other intervertebral disc displacement, lumbar region: Secondary | ICD-10-CM

## 2019-10-31 ENCOUNTER — Ambulatory Visit
Admission: RE | Admit: 2019-10-31 | Discharge: 2019-10-31 | Disposition: A | Discharge status: Home / Self Care | Payer: No Typology Code available for payment source | Source: Ambulatory Visit | Attending: Physician Assistant | Admitting: Physician Assistant

## 2019-10-31 DIAGNOSIS — M545 Low back pain: Secondary | ICD-10-CM | POA: Insufficient documentation

## 2019-10-31 NOTE — PT Progress Note (Signed)
[facility]  Outpatient Rehabilitation Services  [address]  [address] [place], [place] [address]  P: [phone]  F: [phone]      Patient: Jesse Berry       MRN: [id number]   Date of Birth: June 18, 1966  Age: 54 y.o.    Physician Agreement of Patient Plan of Care:     Date: 10/31/2019    Dear Dr. Kenna Gilbert,    Thank you for allowing [name] to participate in the care of [name]. Enclosed is the Plan of Care for Physical Therapy for this patient.     Regulations from the Center for Medicare and Medicaid Services (CMS) require your review and approval of this plan of care. Please note that since a prescription or order for therapy does not legally constitute approval of plan of care, we will not be able to proceed with therapy until we receive your approval of the plan of care.     Please sign and date the Physician Certification Statement below and fax this document back to [name] within 3 business days of receipt so we can initiate the therapy treatment plan.     Sincerely,     Elon Jester  Outpatient Clinical Manager   Senior Director  Rehabilitation [facility]            Physician Certification  I have read and agreed with the plan of care for the above named patient who is under my care.     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      Treatment Plan:        Goals:  Short Term Goals:   1. By 12/01/2019, patient will be independent in HEP and symptom management.  2. By 12/01/2019, patient will report decrease pain to 3/10 on VAS at worst in order to be able  to fall and stay asleep without difficulty without pain medication.  3. By 12/01/2019, patient will improve Oswestry by 90%.    Long Term Goals:  1. By 12/31/2019, patient will improve core and bilateral hip strength to 4+/5 MMT in order to be able to walk 3 miles without difficulty on even and uneven surfaces.  2. By 12/31/2019, patient will improve L/S ROM to 75% in all planes in order to be able to perform supine to/from stand without difficulty.  3. By 12/31/2019, patient will improve pain level to 1/10 on VAS at worst in order to be able to lift 20 pounds floor to waist without difficulty.  Patient's Stated goals: "To be able to walk 3 miles.  To get rid of the pain."    Assessment:   Jesse Berry is a 54 y.o.  male seen in PT for evaluation and treatment.Patient's functional mobility is impacted by decreased ROM, myofascial tightness, and decreased strength. There are few comorbidities or other factors that affect plan of care and require modification of task including chronic pain, obesity, and lack of regular exercise. Standardized tests and exams incorporated into evaluation include ROM, strength, analog pain scale, and Oswestry. Pt demonstrates an evolving clinical presentation due  to pain causing myofascial tightness, and altered strength. Pt will benefit from continued skilled PT to address deficits and improve function.        Cameron Ali, PT, DPT  [place] #  [phone]

## 2019-10-31 NOTE — PT Eval Note (Signed)
[facility]  Outpatient Rehabilitation Services  [address]  [place], [place] [address]  P: [phone]  F: [phone]         Physical Therapy Spine Evaluation        Patient: Jesse Berry                                                     MRN#: [id number]     Start of Care:  10/31/2019                                                  No of Visits: 1/8    Time of treatment:  Start Time : 7:00 AM                              Stop Time: 8:00 AM    Evaluation    Therapeutic Exercise  15 mins   Manual treatment  30 mins     Precautions/Contraindications: None  FALL RISK: No    Referring MD: Dr. 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    SUBJECTIVE:    Patient stated history: In February of 2021, patient was moving furniture and a mattress and felt pain in his L/S.  Patient is an avid runner and has been unable to run since his injury.  Patient has N/T on the plantar surface of his great toe in his L foot.  Patient has decreased sensation in his anterior R thigh.  Patient feels like he is unsteady on his feet as well.  Patient reports that his R thigh N/T is consistent in nature.  His L toe N/T is intermittent.  His L leg also has a shooting sensation from his groin and lateral thigh.  Patient reports that his pain is consistent throughout the day.  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  Medications: FLEXERIL, Meloxicam, Flonase, Epipen, Cetirizine, Symbicort, Albuterol.  Prior level of function: Independent.  Cognition:  Oriented to time, place and person.  Functional Limitations:  Walking > 1 minutes  Sitting with back unsupported > 1 minutes  Falling asleep (not now because of pain medication)  Staying asleep (not now because of pain medication)  Sit to/from stand  Supine to/from sit  Bending  Lifting   Twisting  Pain:  Location: L/S  Type: shooting and N/T  Level on VAS: Current 3/10 Worst: 7/10 Best: 0/10  Patient's stated goals: "To be able to walk 3 miles.  To get rid of the pain."    OBJECTIVE:    Oswestry:   46%  Observation: truncal obesity, decreased lumbar lordosis.  L lateral shift of trunk.  Posture: forward flexed trunk  Gait:  Forward flexed trunk, L lateral shift of trunk.  Imaging:   Taken from 09/15/2019 MRI:  1.  L2-L3 3 mm broad protruded disc herniation, which may cause right L3  nerve root impingement.  2.  L4-L5 left foraminal protruded disc herniation which may cause left  L4 nerve root impingement.  Assistive Device: No    Lumbar ROM (Degrees) L SPINE     Forward Bending 25% + pain     Backward Bending 25%     Side Bending Left 50%     Side Bending Right 50%     Rotation Left 25%     Rotation Right 25% + pain     Strength (Manual Muscle Test) R L    Iliopsoas (L2) 5 5    Quadriceps (L3) 5 5    Tibialis Anterior (L4) 5 5     EHL (L5) 5 5     Peroneus L & B (S1) 5 5     Gluts 5 5     Hamstrings (S1,2) 5 5     Hip ER 3+ 3+     Hip IR 4 4     Able to hold abdominal brace with bilateral SLR: 2 seconds    Sensation (Light Touch):  Dermatome R L   L2 dec WNL   L3 WNL WNL   L4 WNL dec   L5 WNL WNL   S1 WNL WNL   S2 WNL WNL   Reflexes:  Knee Jerk (L3-L4): 0 R, 0 L  Ankle Jerk (S1-S2): 0 R, 0 L  Special Tests:   Pelvic landmarks in standing: equal  Gilette's Test: negative bilaterally  Slump: +L, -R  Supine to Sit Test: negative  SLR: - R, + L  FABER: + L, - R  Prone Knee bend: negativet  Ely's Test: positive for tightness in bilateral quads and hip flexors  Pelvic landmarks in supine: equal  Palpation: Tenderness to  palpation of L QL, L piriformis, L glutes, and increased tension in erector spinae throughout L/S.  Joint Mobility: Decreased mobility T10-L3, increased mobility of L4-S1     ASSESSMENT:  Jesse Berry is a 54 y.o.  male seen in PT for evaluation and treatment.Patient's functional mobility is impacted by decreased ROM, myofascial tightness, and decreased strength. There are few comorbidities or other factors that affect plan of care and require modification of task including chronic pain, obesity, and lack of regular exercise. Standardized tests and exams incorporated into evaluation include ROM, strength, analog pain scale, and Oswestry. Pt demonstrates an evolving clinical presentation due to pain causing myofascial tightness, and altered strength. Pt will benefit from continued skilled PT to address deficits and improve function.     Goals:  Short Term Goals:   1. By 12/01/2019, patient will be independent in HEP and symptom management.  2. By 12/01/2019, patient will report decrease pain to 3/10 on VAS at worst in order to be able to fall and stay asleep without difficulty without pain medication.  3. By 12/01/2019, patient will improve Oswestry by 90%.    Long Term Goals:  1. By 12/31/2019, patient will improve core and bilateral hip strength to 4+/5 MMT in order to be able to walk 3 miles without difficulty on even and uneven surfaces.  2. By 12/31/2019, patient will improve L/S ROM to 75% in all planes in order to be able to perform supine to/from stand without difficulty.  3. By 12/31/2019, patient will improve pain level to 1/10 on VAS  at worst in order to be able to lift 20 pounds floor to waist without difficulty.    Goal potential/prognosis: good    INTERVENTION:  Educated the patient to role of physical therapy, plan of care, goals of therapy and HEP.  Patient educated on proper mechanics for lifting, prone to supine, and supine to sit.  Evaluation, neuromuscular re-education, and manual treatment including  grade II oscillatory flexion-rotation lumbar spine mobilizations and PA lumbar spine mobilizations, manual passive quadratus lumborum, quad, hamstring, and iliopsoas stretch, and STM and MFR to lumbo-sacral musculature.  Also manual sciatic N glide to L LE.  Patient Education: HEP.  HEP Medbridge:  Access Code: [email or web address]  URL: [email or web address]  Date: 10/31/2019  Prepared by: Cameron Ali  Exercises   Supine Figure 4 Piriformis Stretch - 1 x daily - 7 x weekly - 2 sets - 2 reps - 30 second hold   Supine Hamstring Stretch with Strap - 1 x daily - 7 x weekly - 2 sets - 2 reps - 30 second hold   Hip Flexor Stretch at Edge of Bed - 1 x daily - 7 x weekly - 2 sets - 2 reps - 30 second hold   Cat-Camel - 1 x daily - 7 x weekly - 1 sets - 10 reps - 10 second hold   Prone Press Up on Elbows - 1 x daily - 7 x weekly - 1 sets - 10 reps - 10 second hold   Standing Lumbar Extension - 1 x daily - 7 x weekly - 1 sets - 10 reps - 10 second hold   Supine Transversus Abdominis Bracing - Hands on Stomach - 1 x daily - 7 x weekly - 1 sets - 10 reps - 10 second hold   Supine Sciatic Nerve Glide - 1 x daily - 7 x weekly - 1 sets - 30 reps - 1 second hold   Supine Transversus Abdominis Bracing - Hands on Stomach - 1 x daily - 7 x weekly - 10 reps - 3 sets       PLAN:  Pt will be seen 2 x week for 8 more visits for manual therapy, therapeutic exercises, neuromuscular re-education, therapeutic activities, dry needling, home exercise program, and modalities as needed.  Pt will continue with HEP as instructed.      Cameron Ali, PT, DPT  [place] #  [phone]

## 2019-11-07 ENCOUNTER — Ambulatory Visit
Admission: RE | Admit: 2019-11-07 | Discharge: 2019-11-07 | Disposition: A | Discharge status: Home / Self Care | Payer: No Typology Code available for payment source | Source: Ambulatory Visit

## 2019-11-07 NOTE — PT Progress Note (Signed)
[facility]  Department of Rehabilitation  [address]  [place], [place] [address]  Tel: [phone]                    Fax: [phone]      Outpatient Physical Therapy Treatment Note    Patient:  Jesse Berry MRN#: [id number]  Start of care: 10/31/2019    Referring MD: Dr. 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    Subjective:     Patient reports that he continues to have no back pain.  He was did a lot of walking on Saturday and had L groin pain.  Patient was able to perform his HEP over the weekend.v v      Pain (VAS): 5/10  Location: L groin     Patient's medical condition is appropriate for Physical Therapy intervention at this time.     Objective:     Objective Measures This Visit:   Improvement in hip flexion (SLR) in L LE following manual tx.    Manual Treatment:  Grade II-III oscillatory flexion-rotation lumbar spine mobilizations and PA lumbar spine mobilizations to improve pain and ROM  Manual passive quadratus lumborum, quad, hamstring, and iliopsoas stretch to improve flexibility  STM and MFR to lumbo-sacral musculature to improve tissue extensibility, pain level, and blood flow  Manual sciatic N glide to L LE to improve pain    Treatment Activities:  Hand-heel rock 10 X 10 seconds to improve activation of spine into new range of motion.  Modified plank 15 seconds X 3 to improve core strength.  Modified side-plank 15 seconds X 2 each side to improve core strength.  Alternating arm and leg in quadruped 10 X 10 seconds  to improve core strength.     Pt Education:  Educated the patient on plan of care, goals of therapy, and progression of exercises.  Patient also encouraged to perform exercises regularly.    Current HEP:  HEP Medbridge:  Access Code: [email or web address]  URL: [email or web address]  Date: 10/31/2019  Prepared by: Cameron Ali  Exercises   Supine Figure 4 Piriformis Stretch - 1 x daily - 7 x weekly - 2 sets - 2 reps - 30 second hold   Supine Hamstring Stretch with Strap - 1 x daily - 7 x weekly - 2 sets - 2 reps - 30 second hold   Hip Flexor Stretch at Edge of Bed - 1 x daily - 7 x weekly - 2 sets - 2 reps - 30 second hold   Cat-Camel - 1 x daily - 7 x weekly - 1 sets - 10 reps - 10 second hold   Prone Press Up on Elbows - 1 x daily - 7 x weekly - 1 sets - 10 reps - 10 second hold   Standing Lumbar Extension - 1 x daily - 7 x weekly - 1 sets - 10 reps - 10 second hold  Supine Transversus Abdominis Bracing - Hands on Stomach - 1 x daily - 7 x weekly - 1 sets - 10 reps - 10 second hold   Supine Sciatic Nerve Glide - 1 x daily - 7 x weekly - 1 sets - 30 reps - 1 second hold   Supine Transversus Abdominis Bracing - Hands on Stomach - 1 x daily - 7 x weekly - 10 reps - 3 sets      Assessment:     Assessment:  Patient had difficulty with new core exercises which were added today due to decreased core activation and control.  Patient required mod VC's for proper lumbar positioning during planks and side-planks.    Goals:  Short Term Goals:   1. By 12/01/2019, patient will be independent in HEP and symptom management.  2. By 12/01/2019, patient will report decrease pain to 3/10 on VAS at worst in order to be able to fall and stay asleep without difficulty without pain medication.  3. By 12/01/2019, patient will improve Oswestry by 90%.    Long Term Goals:  1. By 12/31/2019, patient will improve core and bilateral hip strength to 4+/5 MMT in order to be able to walk 3 miles without difficulty on even and  uneven surfaces.  2. By 12/31/2019, patient will improve L/S ROM to 75% in all planes in order to be able to perform supine to/from stand without difficulty.  3. By 12/31/2019, patient will improve pain level to 1/10 on VAS at worst in order to be able to lift 20 pounds floor to waist without difficulty.        Plan:     Therapeutic exercise, therapeutic activity, manual treatment, neuromuscular re-education, dry needling, modalities, HEP, and gait training.  Lower extremity and trunk ROM and strengthening.  Trunk, core, and pelvic stabilization.          Cameron Ali, PT, DPT  [place] #  [phone]

## 2019-11-09 ENCOUNTER — Ambulatory Visit
Admission: RE | Admit: 2019-11-09 | Discharge: 2019-11-09 | Disposition: A | Discharge status: Home / Self Care | Payer: No Typology Code available for payment source | Source: Ambulatory Visit

## 2019-11-09 NOTE — PT Progress Note (Signed)
[facility]  Department of Rehabilitation  [address]  [place], [place] [address]  Tel: [phone]                    Fax: [phone]      Outpatient Physical Therapy Treatment Note    Patient:  Jesse Berry MRN#: [id number]  Start of care: 10/31/2019    Referring MD: Dr. 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    Subjective:     Patient reports that he continues to have pain in his L hip.  He feels like he can't walk straight.  Pain (VAS): 4/10  Location: L groin     Patient's medical condition is appropriate for Physical Therapy intervention at this time.     Objective:     Objective Measures This Visit:   Improvement in hip flexion (SLR) in L LE following manual tx.    Manual Treatment:  Grade II-III oscillatory flexion-rotation lumbar spine mobilizations and PA lumbar spine mobilizations to improve pain and ROM  Manual passive quadratus lumborum, quad, hamstring, and iliopsoas stretch to improve flexibility  STM and MFR to lumbo-sacral musculature to improve tissue extensibility, pain level, and blood flow  Manual sciatic N glide to L LE to improve pain    Treatment Activities:  Modified plank 15 seconds X 3 to improve core strength.  Modified side-plank 15 seconds X 2 each side to improve core strength.    Held today:  Alternating arm and leg in quadruped 10 X 10 seconds to improve core strength.   Hand-heel rock 10 X 10 seconds to improve activation of spine into new range of motion.    Pt Education:  Educated the patient  on plan of care, goals of therapy, and progression of exercises.  Patient also encouraged to perform exercises regularly.    Current HEP:  HEP Medbridge:  Access Code: [email or web address]  URL: [email or web address]  Date: 10/31/2019  Prepared by: Cameron Ali  Exercises   Supine Figure 4 Piriformis Stretch - 1 x daily - 7 x weekly - 2 sets - 2 reps - 30 second hold   Supine Hamstring Stretch with Strap - 1 x daily - 7 x weekly - 2 sets - 2 reps - 30 second hold   Hip Flexor Stretch at Edge of Bed - 1 x daily - 7 x weekly - 2 sets - 2 reps - 30 second hold   Cat-Camel - 1 x daily - 7 x weekly - 1 sets - 10 reps - 10 second hold   Prone Press Up on Elbows - 1 x daily - 7 x weekly - 1 sets - 10 reps - 10 second hold   Standing Lumbar Extension - 1 x daily - 7 x weekly - 1 sets - 10 reps - 10 second hold   Supine Transversus Abdominis Bracing - Hands on Stomach - 1 x daily - 7  x weekly - 1 sets - 10 reps - 10 second hold   Supine Sciatic Nerve Glide - 1 x daily - 7 x weekly - 1 sets - 30 reps - 1 second hold   Supine Transversus Abdominis Bracing - Hands on Stomach - 1 x daily - 7 x weekly - 10 reps - 3 sets   Modified planks and side-planks 15 seconds X 2 each    Assessment:     Assessment:  Patient was given modified planks and side-planks for HEP today to improve stability of core.  Patient had increased L hip flexion with knee in extension due to improvement in nerve irritation.    Goals:  Short Term Goals:   1. By 12/01/2019, patient will be independent in HEP and symptom management.  2. By 12/01/2019, patient will report decrease pain to 3/10 on VAS at worst in order to be able to fall and stay asleep without difficulty without pain medication.  3. By 12/01/2019, patient will improve Oswestry by 90%.    Long Term Goals:  1. By 12/31/2019, patient will improve core and bilateral hip strength to 4+/5 MMT in order to be able to walk 3 miles without difficulty on even and uneven surfaces.  2. By  12/31/2019, patient will improve L/S ROM to 75% in all planes in order to be able to perform supine to/from stand without difficulty.  3. By 12/31/2019, patient will improve pain level to 1/10 on VAS at worst in order to be able to lift 20 pounds floor to waist without difficulty.        Plan:     Therapeutic exercise, therapeutic activity, manual treatment, neuromuscular re-education, dry needling, modalities, HEP, and gait training.  Lower extremity and trunk ROM and strengthening.  Trunk, core, and pelvic stabilization.          Cameron Ali, PT, DPT  [place] #  [phone]

## 2019-11-14 ENCOUNTER — Other Ambulatory Visit (INDEPENDENT_AMBULATORY_CARE_PROVIDER_SITE_OTHER): Payer: Self-pay | Admitting: Physician Assistant

## 2019-11-14 ENCOUNTER — Ambulatory Visit
Admission: RE | Admit: 2019-11-14 | Discharge: 2019-11-14 | Disposition: A | Discharge status: Home / Self Care | Payer: No Typology Code available for payment source | Source: Ambulatory Visit

## 2019-11-14 DIAGNOSIS — M5126 Other intervertebral disc displacement, lumbar region: Secondary | ICD-10-CM

## 2019-11-14 NOTE — Telephone Encounter (Signed)
This pt is requesting refill on Meloxicam but I'm worried if it may cause elevated Cr and BUN w/ him taking this med for weeks. Would you pls call pt and advise he come for routine PE if he hasn't had 1 in > 1 year. We can check his BMP then and assess if appropriate for him to continue med. Thanks! :o)

## 2019-11-14 NOTE — PT Progress Note (Signed)
[facility]  Department of Rehabilitation  [address]  [place], [place] [address]  Tel: [phone]                    Fax: [phone]      Outpatient Physical Therapy Treatment Note    Patient:  Jesse Berry MRN#: [id number]  Start of care: 10/31/2019    Referring MD: Dr. 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    Subjective:     Patient reports that his back is "fine" today.  Patient has no pain in his L/S but his pain in his groin is a 7/10 on VAS when he performs prolonged sit to stand.  He does have no pain at all at times.  Pain (VAS): 4/10  Location: L groin     Patient's medical condition is appropriate for Physical Therapy intervention at this time.     Objective:     Objective Measures This Visit:   Improvement in hip flexion (SLR) in L LE following manual tx.    Manual Treatment:  Grade II-III oscillatory flexion-rotation lumbar spine mobilizations and PA lumbar spine mobilizations to improve pain and ROM  Manual passive quadratus lumborum, quad, hamstring, and iliopsoas stretch to improve flexibility  STM and MFR to lumbo-sacral musculature to improve tissue extensibility, pain level, and blood flow  Manual sciatic N glide to L LE to improve pain    Treatment Activities:  Modified plank 20 seconds X 3 to improve core strength.  Modified side-plank 20  seconds X 2 each side to improve core strength.  Alternating arm and leg in quadruped 10 X 10 seconds to improve core strength.   Hand-heel rock 10 X 10 seconds to  improve activation of spine into new range of motion.  Dead bug 10 X 10 seconds to improve core strength.     Pt Education:  Educated the patient on plan of care, goals of therapy, and progression of exercises.  Patient also encouraged to perform exercises regularly.    Current HEP:  HEP Medbridge:  Access Code: [email or web address]  URL: [email or web address]  Date: 10/31/2019  Prepared by: Cameron Ali  Exercises   Supine Figure 4 Piriformis Stretch - 1 x daily - 7 x weekly - 2 sets - 2 reps - 30 second hold   Supine Hamstring Stretch with Strap - 1 x daily - 7 x weekly - 2 sets - 2 reps - 30 second hold   Hip Flexor Stretch at Edge of Bed - 1 x daily - 7 x weekly - 2 sets - 2 reps - 30 second hold   Cat-Camel - 1 x daily - 7 x weekly - 1 sets - 10 reps - 10 second hold   Prone Press Up on Elbows - 1 x daily - 7 x weekly - 1 sets - 10 reps - 10 second hold   Standing Lumbar Extension - 1  x daily - 7 x weekly - 1 sets - 10 reps - 10 second hold   Supine Transversus Abdominis Bracing - Hands on Stomach - 1 x daily - 7 x weekly - 1 sets - 10 reps - 10 second hold   Supine Sciatic Nerve Glide - 1 x daily - 7 x weekly - 1 sets - 30 reps - 1 second hold   Supine Transversus Abdominis Bracing - Hands on Stomach - 1 x daily - 7 x weekly - 10 reps - 3 sets   Modified planks and side-planks 15 seconds X 2 each    Assessment:     Assessment:  Patient had difficulty with dead bug which was added today due to decreased core strength.  Continued cramping in L LE with L side-plank.    Goals:  Short Term Goals:   1. By 12/01/2019, patient will be independent in HEP and symptom management.  2. By 12/01/2019, patient will report decrease pain to 3/10 on VAS at worst in order to be able to fall and stay asleep without difficulty without pain medication.  3. By 12/01/2019, patient will improve Oswestry by 90%.    Long Term Goals:  1. By 12/31/2019, patient will improve core and bilateral hip strength to 4+/5 MMT in  order to be able to walk 3 miles without difficulty on even and uneven surfaces.  2. By 12/31/2019, patient will improve L/S ROM to 75% in all planes in order to be able to perform supine to/from stand without difficulty.  3. By 12/31/2019, patient will improve pain level to 1/10 on VAS at worst in order to be able to lift 20 pounds floor to waist without difficulty.        Plan:     Therapeutic exercise, therapeutic activity, manual treatment, neuromuscular re-education, dry needling, modalities, HEP, and gait training.  Lower extremity and trunk ROM and strengthening.  Trunk, core, and pelvic stabilization.          Cameron Ali, PT, DPT  [place] #  [phone]

## 2019-11-16 ENCOUNTER — Ambulatory Visit
Admission: RE | Admit: 2019-11-16 | Discharge: 2019-11-16 | Disposition: A | Discharge status: Home / Self Care | Payer: No Typology Code available for payment source | Source: Ambulatory Visit | Attending: Physician Assistant | Admitting: Physician Assistant

## 2019-11-16 DIAGNOSIS — M545 Low back pain: Secondary | ICD-10-CM | POA: Insufficient documentation

## 2019-11-16 DIAGNOSIS — M5126 Other intervertebral disc displacement, lumbar region: Secondary | ICD-10-CM | POA: Insufficient documentation

## 2019-11-16 NOTE — Telephone Encounter (Signed)
Left detailed message for patient.

## 2019-11-16 NOTE — PT Progress Note (Signed)
[facility]  Department of Rehabilitation  [address]  [place], [place] [address]  Tel: [phone]                    Fax: [phone]      Outpatient Physical Therapy Treatment Note    Patient:  Jesse Berry MRN#: [id number]  Start of care: 10/31/2019    Referring MD: Dr. 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    Subjective:     Patient reports that his pain is slowly getting better.  Pain (VAS): 2 /10  Location: L groin     Patient's medical condition is appropriate for Physical Therapy intervention at this time.     Objective:     Objective Measures This Visit:   Improvement in hip flexion (SLR) in L LE following manual tx.    Manual Treatment:  Grade II-III oscillatory flexion-rotation lumbar spine mobilizations and PA lumbar spine mobilizations to improve pain and ROM  Manual passive quadratus lumborum, quad, hamstring, and iliopsoas stretch to improve flexibility  STM and MFR to lumbo-sacral musculature to improve tissue extensibility, pain level, and blood flow  Manual sciatic N glide to L LE to improve pain    Treatment Activities:  Modified plank 25 seconds X 3 to improve core strength.  Modified side-plank 25  seconds each side to improve core strength.  Alternat ing arm and leg in quadruped 10 X 10 seconds to improve core strength.   Hand-heel rock 10 X 10 seconds to improve activation of spine into new range of motion.  Dead bug 10 X 10 seconds to improve core strength.     Pt Education:  Educated the patient on plan  of care, goals of therapy, and progression of exercises.  Patient also encouraged to perform exercises regularly.    Current HEP:  HEP Medbridge:  Access Code: [email or web address]  URL: [email or web address]  Date: 10/31/2019  Prepared by: Cameron Ali  Exercises   Supine Figure 4 Piriformis Stretch - 1 x daily - 7 x weekly - 2 sets - 2 reps - 30 second hold   Supine Hamstring Stretch with Strap - 1 x daily - 7 x weekly - 2 sets - 2 reps - 30 second hold   Hip Flexor Stretch at Edge of Bed - 1 x daily - 7 x weekly - 2 sets - 2 reps - 30 second hold   Cat-Camel - 1 x daily - 7 x weekly - 1 sets - 10 reps - 10 second hold   Prone Press Up on Elbows - 1 x daily - 7 x weekly - 1 sets - 10 reps - 10 second hold   Standing Lumbar Extension - 1 x daily - 7 x weekly - 1 sets - 10 reps - 10 second hold   Supine Transversus Abdominis Bracing - Hands on Stomach - 1 x daily - 7 x weekly -  1 sets - 10 reps - 10 second hold   Supine Sciatic Nerve Glide - 1 x daily - 7 x weekly - 1 sets - 30 reps - 1 second hold   Supine Transversus Abdominis Bracing - Hands on Stomach - 1 x daily - 7 x weekly - 10 reps - 3 sets   Modified planks and side-planks 15 seconds X 2 each    Assessment:     Assessment:  Patient was able to perform planks and side-planks for 25 seconds due to improvement in core endurance.  Patient tolerated all tx without complications.    Goals:  Short Term Goals:   1. By 12/01/2019, patient will be independent in HEP and symptom management.  2. By 12/01/2019, patient will report decrease pain to 3/10 on VAS at worst in order to be able to fall and stay asleep without difficulty without pain medication.  3. By 12/01/2019, patient will improve Oswestry by 90%.    Long Term Goals:  1. By 12/31/2019, patient will improve core and bilateral hip strength to 4+/5 MMT in order to be able to walk 3 miles without difficulty on even and uneven surfaces.  2. By 12/31/2019, patient will improve L/S ROM to 75% in all  planes in order to be able to perform supine to/from stand without difficulty.  3. By 12/31/2019, patient will improve pain level to 1/10 on VAS at worst in order to be able to lift 20 pounds floor to waist without difficulty.        Plan:     Therapeutic exercise, therapeutic activity, manual treatment, neuromuscular re-education, dry needling, modalities, HEP, and gait training.  Lower extremity and trunk ROM and strengthening.  Trunk, core, and pelvic stabilization.          Cameron Ali, PT, DPT  [place] #  [phone]

## 2019-11-21 ENCOUNTER — Ambulatory Visit
Admission: RE | Admit: 2019-11-21 | Discharge: 2019-11-21 | Disposition: A | Discharge status: Home / Self Care | Payer: No Typology Code available for payment source | Source: Ambulatory Visit

## 2019-11-21 NOTE — PT Progress Note (Signed)
[facility]  Department of Rehabilitation  [address]  [place], [place] [address]  Tel: [phone]                    Fax: [phone]      Outpatient Physical Therapy Treatment Note    Patient:  Jesse Berry MRN#: [id number]  Start of care: 10/31/2019    Referring MD: Dr. 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    Subjective:     Patient reports that he still has intermittent groin pain.  Patient reports that he carried a grocery cart yesterday 2/10 mile and felt groin pain as a result.  Pain (VAS): 0/10  Location: L groin     Patient's medical condition is appropriate for Physical Therapy intervention at this time.      Objective:     Objective Measures This Visit:   Patient has improvement in iliopsoas flexibility and core strength.    Manual Treatment:  Grade II-III oscillatory flexion-rotation lumbar spine mobilizations and PA lumbar spine mobilizations to improve pain and ROM  Manual passive quadratus lumborum, and iliopsoas stretch to improve flexibility  STM and MFR to lumbo-sacral musculature to improve tissue extensibility, pain level, and blood flow    Treatment Activities:  Regular full plank 10 seconds X 3 to improve core strength.  Regular full side-plank 10 seconds 2 X each side to improve core strength.  Hand-heel rock 10 X 10 seconds to improve activation of spine into new range of motion.  Dead bug 10 X 10 seconds to improve core strength.   Held today: Alternating arm and leg in quadruped 10 X 10 seconds to  improve core strength.    Pt Education:  Educated the patient on plan of care, goals of therapy, and progression of exercises.  Patient also encouraged to perform exercises regularly.    Current HEP:  Access Code: [email or web address]  URL: [email or web address]  Date: 11/21/2019  Prepared by: Cameron Ali  Exercises   Supine Figure 4 Piriformis Stretch - 1 x daily - 7 x weekly - 2 sets - 2 reps - 30 second hold   Supine Hamstring Stretch with Strap - 1 x daily - 7 x weekly - 2 sets - 2 reps - 30 second hold   Cat-Camel - 1 x daily - 7 x weekly - 1 sets - 10 reps - 10 second hold   Prone Press Up on Elbows - 1 x daily - 7 x weekly - 1 sets - 10 reps - 10 second hold   Standing Lumbar Extension - 1 x daily - 7 x weekly - 1 sets - 10 reps - 10 second hold   Supine Sciatic Nerve Glide - 1 x daily - 7 x weekly - 1 sets - 30 reps - 1 second hold   Half Kneeling Hip Flexor Stretch - 1 x daily - 7 x weekly - 2 sets -  2 reps - 30 second hold   Dead Bug - 1 x daily - 7 x weekly - 2 sets - 10 reps - 2 second hold   Standard Plank - 1 x daily - 7 x weekly - 1 sets - 3 reps - 10 second hold   Side Plank on Elbow - 1 x daily - 7 x weekly - 2 sets - 2 reps - 10 second hold       Assessment:     Assessment:  Modified planks were progressed to side-planks today due to improvement in core strength.  HEP was updated and patient was given copy and emailed copy.  Patient had improvement in mobility in L/S following manual tx.    Goals:  Short Term Goals:   1. By 12/01/2019, patient will be independent in HEP and symptom management.  2. By 12/01/2019, patient will report decrease pain to 3/10 on VAS at worst in order to be able to fall and stay asleep without difficulty without pain medication.  3. By 12/01/2019, patient will improve Oswestry by 90%.    Long Term Goals:  1. By 12/31/2019, patient will improve core and bilateral hip strength to 4+/5 MMT in order to be able to walk 3 miles without difficulty on even and  uneven surfaces.  2. By 12/31/2019, patient will improve L/S ROM to 75% in all planes in order to be able to perform supine to/from stand without difficulty.  3. By 12/31/2019, patient will improve pain level to 1/10 on VAS at worst in order to be able to lift 20 pounds floor to waist without difficulty.        Plan:     Therapeutic exercise, therapeutic activity, manual treatment, neuromuscular re-education, dry needling, modalities, HEP, and gait training.  Lower extremity and trunk ROM and strengthening.  Trunk, core, and pelvic stabilization.          Cameron Ali, PT, DPT  [place] #  [phone]

## 2019-11-23 ENCOUNTER — Ambulatory Visit
Admission: RE | Admit: 2019-11-23 | Discharge: 2019-11-23 | Disposition: A | Discharge status: Home / Self Care | Payer: No Typology Code available for payment source | Source: Ambulatory Visit

## 2019-11-23 NOTE — PT Progress Note (Signed)
[facility]  Department of Rehabilitation  [address]  [place], [place] [address]  Tel: [phone]                    Fax: [phone]      Outpatient Physical Therapy Treatment Note    Patient:  Jesse Berry MRN#: [id number]  Start of care: 10/31/2019    Referring MD: Dr. 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    Subjective:     Patient reports that he had a rough day yesterday in terms of pain.  He was getting into and out of the car and had increased pain as a result.  Patient had a lot of L groin pain as a result.  He feels that his pain was after getting up from prolonged sitting in the car.  Pain (VAS): 2/10 today.  Yesterday: 8/10.  Location: L groin     Patient's medical condition is appropriate for Physical Therapy intervention at this time.      Objective:     Objective Measures This Visit:   Patient has improvement in iliopsoas flexibility and core strength.    Manual Treatment:  Grade II-III oscillatory flexion-rotation lumbar spine mobilizations and PA lumbar spine mobilizations to improve pain and ROM  Manual passive quadratus lumborum, and iliopsoas stretch to improve flexibility  STM and MFR to lumbo-sacral musculature to improve tissue extensibility, pain level, and blood flow     Treatment Activities:  Regular full plank 10 seconds X 3 to improve core strength.  Regular full side-plank 10 seconds 2 X each side to improve core strength.  Hand-heel rock 10 X 10 seconds to improve activation of spine into new  range of motion.  Dead bug 10 X 10 seconds to improve core strength.   Held today: Alternating arm and leg in quadruped 10 X 10 seconds to improve core strength.    Pt Education:  Educated the patient on plan of care, goals of therapy, and progression of exercises.  Patient also encouraged to perform exercises regularly.    Current HEP:  Access Code: [email or web address]  URL: [email or web address]  Date: 11/21/2019  Prepared by: Cameron Ali  Exercises   Supine Figure 4 Piriformis Stretch - 1 x daily - 7 x weekly - 2 sets - 2 reps - 30 second hold   Supine Hamstring Stretch with Strap - 1 x daily - 7 x weekly - 2 sets - 2 reps - 30 second hold   Cat-Camel - 1 x daily - 7 x weekly - 1 sets - 10 reps - 10 second hold   Prone Press Up on Elbows - 1 x daily - 7 x weekly - 1 sets - 10 reps - 10 second hold   Standing Lumbar Extension - 1 x daily - 7 x weekly - 1 sets - 10 reps - 10 second hold   Supine Sciatic Nerve Glide -  1 x daily - 7 x weekly - 1 sets - 30 reps - 1 second hold   Half Kneeling Hip Flexor Stretch - 1 x daily - 7 x weekly - 2 sets - 2 reps - 30 second hold   Dead Bug - 1 x daily - 7 x weekly - 2 sets - 10 reps - 2 second hold   Standard Plank - 1 x daily - 7 x weekly - 1 sets - 3 reps - 10 second hold   Side Plank on Elbow - 1 x daily - 7 x weekly - 2 sets - 2 reps - 10 second hold       Assessment:     Assessment:  Patient continues to have difficulty with side-planks due to weakness in core.  However, form for front planks was improved due to improved core activation and body awareness.    Goals:  Short Term Goals:   1. By 12/01/2019, patient will be independent in HEP and symptom management.  2. By 12/01/2019, patient will report decrease pain to 3/10 on VAS at worst in order to be able to fall and stay asleep without difficulty without pain medication.  3. By 12/01/2019, patient will improve Oswestry by 90%.    Long Term Goals:  1. By 12/31/2019, patient will improve core and  bilateral hip strength to 4+/5 MMT in order to be able to walk 3 miles without difficulty on even and uneven surfaces.  2. By 12/31/2019, patient will improve L/S ROM to 75% in all planes in order to be able to perform supine to/from stand without difficulty.  3. By 12/31/2019, patient will improve pain level to 1/10 on VAS at worst in order to be able to lift 20 pounds floor to waist without difficulty.        Plan:     Therapeutic exercise, therapeutic activity, manual treatment, neuromuscular re-education, dry needling, modalities, HEP, and gait training.  Lower extremity and trunk ROM and strengthening.  Trunk, core, and pelvic stabilization.          Cameron Ali, PT, DPT  [place] #  [phone]

## 2019-11-27 ENCOUNTER — Other Ambulatory Visit: Payer: Self-pay | Admitting: Family Medicine

## 2019-11-27 DIAGNOSIS — J4540 Moderate persistent asthma, uncomplicated: Secondary | ICD-10-CM

## 2019-11-28 ENCOUNTER — Ambulatory Visit
Admission: RE | Admit: 2019-11-28 | Discharge: 2019-11-28 | Disposition: A | Discharge status: Home / Self Care | Payer: No Typology Code available for payment source | Source: Ambulatory Visit

## 2019-11-28 ENCOUNTER — Encounter (INDEPENDENT_AMBULATORY_CARE_PROVIDER_SITE_OTHER): Payer: No Typology Code available for payment source | Admitting: Internal Medicine

## 2019-11-28 NOTE — PT Progress Note (Addendum)
[facility]  Outpatient Rehabilitation Services  [address]  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    SUBJECTIVE:    Patient stated history: In February of 2021, patient was moving furniture and a mattress and felt pain in his L/S.  Patient is an avid runner and has been unable to run since his injury.  Patient has N/T on the plantar surface of his great toe in his L foot.  Patient has decreased sensation in his anterior R thigh.  Patient feels like he is unsteady on his feet as well.  Patient reports that his R thigh N/T is consistent in nature.  His L toe N/T is intermittent.  His L leg also has a shooting sensation from his groin and lateral thigh.  Patient reports that his pain is consistent throughout the day.  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  11/28/2019:  Patient reports that he feels like he is doing better overall.  Patient reports that he no longer has N/T in his great toe.  He continues to have a "very slight" decrease in sensation in his L anterior thigh.  Patient reports that his pain in his L groin is intermittent in nature.  Patient does not have a f/u with his MD at this time.  Patient is able to sit for as long as he would like with proper posture but does get discomfort  when he slouches.  Patient no longer is having difficulty with falling or staying asleep due to pain.  Patient no longer has difficulty with supine to/from sit, with bending, or with lifting.  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  Medications: FLEXERIL, Meloxicam, Flonase, Epipen, Cetirizine, Symbicort, Albuterol.  Prior level of function: Independent.  Cognition: Oriented to time, place and person.  Functional Limitations:  Walking > 5 minutes  Sit to/from stand  Twisting  Pain:  Location: L/S  Type: shooting and N/T  Level on VAS: Current 0/10    Worst: 8/10      Best: 0/10  Patient's stated goals: "To be able to walk 3 miles.  To get rid of the pain."    OBJECTIVE:    Oswestry:   28%  Observation: truncal obesity, decreased lumbar lordosis.  Posture: forward flexed trunk  Gait: Forward flexed trunk  Imaging:   Taken from 09/15/2019 MRI:  1. L2-L3 3 mm broad protruded disc herniation, which may cause right L3  nerve root impingement.  2. L4-L5 left foraminal protruded disc herniation which may cause left  L4 nerve root impingement.  Assistive Device: No    Lumbar ROM (Degrees) L SPINE     Forward Bending 75%     Backward Bending 50%     Side Bending Left 50%     Side Bending Right 50%     Rotation Left 50%     Rotation Right 50% + pain     Strength (Manual Muscle Test) R L    Iliopsoas (L2) 5 5    Quadriceps (L3) 5 5    Tibialis Anterior (L4) 5 5     EHL (L5) 5 5     Peroneus L & B (S1) 5 5     Gluts 5 5     Hamstrings (S1,2) 5 5     Hip ER 4 4     Hip IR 4+ 4+     Able to hold abdominal brace with bilateral SLR: 30 seconds    Sensation (Light Touch):  Dermatome R L   L2 dec WNL   L3 WNL WNL   L4 WNL dec   L5 WNL WNL   S1 WNL WNL   S2 WNL WNL   Reflexes:  Knee Jerk (L3-L4): 0 R, 0 L  Ankle Jerk (S1-S2): 0 R, 0 L  Special Tests:   Pelvic landmarks  in standing: equal  Gilette's Test: negative bilaterally  Slump: +L, -R  Supine to Sit Test: negative  SLR: - R, - L  FABER: - L, - R  Prone Knee bend: negative  Ely's Test: positive for tightness in bilateral hip flexors  Pelvic landmarks in supine: equal  Palpation: Tenderness to palpation of L QL, L piriformis, L glutes, and increased tension in erector spinae throughout L/S.  Joint Mobility: Decreased mobility T10-L3, increased mobility of L4-S1     ASSESSMENT:  Jesse Berry is a 54 y.o.  male seen in PT for re-evaluation and treatment.Patient's functional mobility is impacted by decreased ROM, myofascial tightness, and decreased strength. There are few comorbidities or other factors that affect plan of care and require modification of task including chronic pain, obesity, and lack of regular exercise. Standardized tests and exams incorporated into evaluation include ROM, strength, analog pain scale, and Oswestry.  Patient has improvement in hip and core strength but does not yet have full strength.  Patient's L/S ROM also has improvement but patient does continue to have pain with R rotation.  Patient is now able to fall and stay asleep without difficulty, can sit for as long as he would like (when using proper posture), and no longer has pain with bending, lifting, or twisting.  However, patient does continue to have pain with walking over 5 minutes and intermittently. Pt will benefit from continued skilled PT to address deficits and improve function.      Goals:  Short Term Goals:   1. By4/16/2021, patient will be independent in HEP and symptom management: 75% met.  2. By4/16/2021, patient will report decrease pain to 3/10 on VAS at worst in  order to be able tofall and stay asleep without difficulty without pain medication: 75% met.  3. By4/16/2021, patient will improve Oswestry by 90%: 50% met.    Long Term Goals:  1. By5/16/2021, patient will improve core and bilateral hip strength to 4+/5 MMT in  order to be able towalk 3 miles without difficulty on even and uneven surfaces: 75% met.  2. By5/16/2021, patient will improve L/S ROM to 75% in all planes in order to be able toperform supine to/from stand without difficulty: 50% met.  3. By5/16/2021, patient will improve pain level to 1/10 on VAS at worst in order to be able tolift 20 pounds floor to waist without difficulty: 25% met.    Goal potential/prognosis: good    INTERVENTION:  Educated the patient to role of physical therapy, plan of care, goals of therapy and HEP.     Manual Treatment:    Grade II-III oscillatory flexion-rotation lumbar spine mobilizations and PA lumbar spine mobilizations to improve pain and ROM    Manual passive quadratus lumborum,and iliopsoas stretch to improve flexibility    STM and MFR to lumbo-sacral musculature to improve tissue extensibility, pain level, and blood flow     Patient Education: HEP.  HEP Medbridge:    Current HEP:     Access Code: [email or web address]    URL: [email or web address]    Date: 11/21/2019    Prepared by: Cameron Ali    Exercises               Supine Figure 4 Piriformis Stretch - 1 x daily - 7 x weekly - 2 sets - 2 reps - 30 second hold               Supine Hamstring Stretch with Strap - 1 x daily - 7 x weekly - 2 sets - 2 reps - 30 second hold               Cat-Camel - 1 x daily - 7 x weekly - 1 sets - 10 reps - 10 second hold               Prone Press Up on Elbows - 1 x daily - 7 x weekly - 1 sets - 10 reps - 10 second hold               Standing Lumbar Extension - 1 x daily - 7 x weekly - 1 sets - 10 reps - 10 second hold               Supine Sciatic Nerve Glide - 1 x daily - 7 x weekly - 1 sets - 30 reps - 1 second hold               Half Kneeling Hip Flexor Stretch - 1 x daily - 7 x weekly - 2 sets - 2 reps - 30 second hold               Dead Bug - 1 x daily - 7 x weekly - 2 sets - 10 reps - 2 second hold               Standard Plank - 1 x daily - 7 x weekly - 1 sets - 3  reps - 10 second hold               Side Plank       PLAN:  Pt will be seen 2 x week for 23 more  visits for manual therapy, therapeutic exercises, neuromuscular re-education, therapeutic activities, dry needling, home exercise program, and modalities as needed.  Pt will continue with HEP as instructed.      Cameron Ali, PT, DPT  [place] #  [id number]

## 2019-11-28 NOTE — PT Plan of Care Note (Cosign Needed)
[facility] Outpatient Physical Therapy  [address]  [place], [place] [address]  [phone]    Physical Therapy Plan of Care        Patient: Jesse Berry       MRN: [id number]   Date of Birth: [birth year]  Age: 54 y.o.    Physician Agreement of Patient Plan of Care:   Date: 11/28/2019    Dear Jesse Kuster Conley Rolls, PA,    Thank you for allowing [name] to participate in the care of Jesse Berry. Enclosed is the Plan of Care for Physical Therapy for this patient.     Regulations from the Center for Medicare and Medicaid Services (CMS) require your review and approval of this plan of care. Please note that since a prescription or order for therapy does not legally constitute approval of plan of care, we will not be able to proceed with therapy until we receive your approval of the plan of care.     Please sign and date the Physician Certification Statement below and fax this document back to [name] within 3 business days of receipt so we can initiate the therapy treatment plan.       Sincerely,      Elon Jester  Outpatient Clinical Manager                           Senior Director  Rehabilitation [facility]    Physician Certification  I have read and agreed with the plan of care for the above named patient who is under my care.     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      Treatment Plan:     Goals:  Short Term Goals:   1. By4/16/2021, patient will be independent in HEP and symptom  management: 75% met.  2. By4/16/2021, patient will report decrease pain to 3/10 on VAS at worst in order to be able tofall and stay asleep without difficulty without pain medication: 75% met.  3. By4/16/2021, patient will improve Oswestry by 90%: 50% met.    Long Term Goals:  1. By5/16/2021, patient will improve core and bilateral hip strength to 4+/5 MMT in order to be able towalk 3 miles without difficulty on even and uneven surfaces: 75% met.  2. By5/16/2021, patient will improve L/S ROM to 75% in all planes in order to be able toperform supine to/from stand without difficulty: 50% met.  3. By5/16/2021, patient will improve pain level to 1/10 on VAS at worst in order to be able tolift 20 pounds  floor to waist without difficulty: 25% met.    Goal potential/prognosis: good    Assessment:     Jesse Berry a 54 y.o.maleseen in PT for re-evaluation and treatment.Patient's functional mobility is impacted by decreased ROM, myofascial tightness, anddecreased strength. There are few comorbidities or other factors that affect plan of care and require modification of task including chronic pain, obesity,andlack of regular exercise. Standardized tests and exams incorporated into evaluation include ROM, strength, analog pain scale,andOswestry.  Patient has improvement in hip and core strength but does not yet have full strength.  Patient's L/S ROM also has improvement but patient does continue to have pain with R rotation.  Patient is now able to fall and stay asleep without difficulty, can sit for as long as he would like (when using proper posture), and no longer has pain with bending, lifting, or twisting.  However, patient does continue to have pain with walking over 5 minutes and intermittently. Pt will benefit from continued skilled PT to address deficits and improve function.        Cameron Ali PT, DPT  PT# [id number]

## 2019-11-29 ENCOUNTER — Encounter (INDEPENDENT_AMBULATORY_CARE_PROVIDER_SITE_OTHER): Payer: No Typology Code available for payment source | Admitting: Internal Medicine

## 2019-11-29 ENCOUNTER — Encounter (INDEPENDENT_AMBULATORY_CARE_PROVIDER_SITE_OTHER): Payer: Self-pay

## 2019-11-30 ENCOUNTER — Ambulatory Visit
Admission: RE | Admit: 2019-11-30 | Discharge: 2019-11-30 | Disposition: A | Discharge status: Home / Self Care | Payer: No Typology Code available for payment source | Source: Ambulatory Visit

## 2019-11-30 NOTE — PT Progress Note (Signed)
[facility]  Department of Rehabilitation  [address]  [place], [place] [address]  Tel: [phone]                    Fax: [phone]      Outpatient Physical Therapy Treatment Note    Patient:  Jesse Berry MRN#: [id number]  Start of care: 10/31/2019    Referring MD: Dr. 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    Subjective:     Patient reports that he had sharp pain after walking fast to get into his appointment.  He has had some soreness since yesterday.  Pain (VAS): 8/10 today.  Location: L groin     Patient's medical condition is appropriate for Physical Therapy intervention at this time.      Objective:     Objective Measures This Visit:   Improvement in core endurance.  Tightness in bilateral hamstrings    Manual Treatment:  Grade II-III oscillatory flexion-rotation lumbar spine mobilizations and PA lumbar spine mobilizations to improve pain and ROM  Manual passive quadratus lumborum, and iliopsoas stretch to improve flexibility  STM and MFR to lumbo-sacral musculature to improve tissue extensibility, pain level, and blood flow     Treatment Activities:  Regular full plank 15 seconds X 2 to improve core strength.  Regular full side-plank 15 seconds 2 X each side to improve core strength.  Hand-heel rock 10 X 10 seconds to improve activation of spine into new range of motion.  Dead bug 10 X 10 seconds to improve core strength.   Held today: Alternating arm and leg in quadruped 10 X 10 seconds to improve core  strength.    Pt Education:  Educated the patient on plan of care, goals of therapy, and progression of exercises.  Patient also encouraged to perform exercises regularly.    Current HEP:  Access Code: [email or web address]  URL: [email or web address]  Date: 11/21/2019  Prepared by: Cameron Ali  Exercises   Supine Figure 4 Piriformis Stretch - 1 x daily - 7 x weekly - 2 sets - 2 reps - 30 second hold   Supine Hamstring Stretch with Strap - 1 x daily - 7 x weekly - 2 sets - 2 reps - 30 second hold   Cat-Camel - 1 x daily - 7 x weekly - 1 sets - 10 reps - 10 second hold   Prone Press Up on Elbows - 1 x daily - 7 x weekly - 1 sets - 10 reps - 10 second hold   Standing Lumbar Extension - 1 x daily - 7 x weekly - 1 sets - 10 reps - 10 second hold   Supine Sciatic Nerve Glide - 1 x daily - 7 x weekly - 1 sets - 30 reps - 1 second hold   Half Kneeling Hip Flexor Stretch - 1 x daily - 7 x weekly - 2 sets - 2 reps -  30 second hold   Dead Bug - 1 x daily - 7 x weekly - 2 sets - 10 reps - 2 second hold   Standard Plank - 1 x daily - 7 x weekly - 1 sets - 3 reps - 10 second hold   Side Plank on Elbow - 1 x daily - 7 x weekly - 2 sets - 2 reps - 10 second hold       Assessment:     Assessment:  Patient was able to perform increased time for planks and side-planks due to improvement in core endurance.  Patient continues to have tightness in hamstrings which improved with manual tx.    Goals:  Short Term Goals:   1. By 12/01/2019, patient will be independent in HEP and symptom management.  2. By 12/01/2019, patient will report decrease pain to 3/10 on VAS at worst in order to be able to fall and stay asleep without difficulty without pain medication.  3. By 12/01/2019, patient will improve Oswestry by 90%.    Long Term Goals:  1. By 12/31/2019, patient will improve core and bilateral hip strength to 4+/5 MMT in order to be able to walk 3 miles without difficulty on even and uneven surfaces.  2. By 12/31/2019, patient  will improve L/S ROM to 75% in all planes in order to be able to perform supine to/from stand without difficulty.  3. By 12/31/2019, patient will improve pain level to 1/10 on VAS at worst in order to be able to lift 20 pounds floor to waist without difficulty.        Plan:     Therapeutic exercise, therapeutic activity, manual treatment, neuromuscular re-education, dry needling, modalities, HEP, and gait training.  Lower extremity and trunk ROM and strengthening.  Trunk, core, and pelvic stabilization.          Cameron Ali, PT, DPT  [place] #  [phone]

## 2019-12-07 ENCOUNTER — Ambulatory Visit
Admission: RE | Admit: 2019-12-07 | Discharge: 2019-12-07 | Disposition: A | Discharge status: Home / Self Care | Payer: No Typology Code available for payment source | Source: Ambulatory Visit

## 2019-12-07 NOTE — PT Progress Note (Signed)
[facility]  Department of Rehabilitation  [address]  [place], [place] [address]  Tel: [phone]                    Fax: [phone]      Outpatient Physical Therapy Treatment Note    Patient:  Jesse Berry MRN#: [id number]  Start of care: 10/31/2019    Referring MD: Dr. 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    Subjective:     Patient reports that his pain is localized to his L groin today.  He felt much better after last session.  Pain (VAS): 0/10 today.  Location: L groin     Patient's medical condition is appropriate for Physical Therapy intervention at this time.      Objective:     Objective Measures This Visit:   No objective measures takent his visit.    Manual Treatment:  Grade II-III oscillatory flexion-rotation lumbar spine mobilizations and PA lumbar spine mobilizations to improve pain and ROM  Manual passive quadratus lumborum, and iliopsoas stretch to improve flexibility  STM and MFR to lumbo-sacral musculature to improve tissue extensibility, pain level, and blood flow     Treatment Activities:  Regular full plank 20 seconds X 2 to improve core strength.  Regular full side-plank 20 seconds 2 X each side to improve core strength.  Hand-heel rock 10 X 10 seconds to improve activation of spine into new range of motion.  Dead bug 10 X 10 seconds to improve core strength.   Held today: Alternating arm and leg in quadruped 10 X 10 seconds to improve core strength.    Pt Education:  Educated the patient on plan of  care, goals of therapy, and progression of exercises.  Patient also encouraged to perform exercises regularly.    Current HEP:  Access Code: [email or web address]  URL: [email or web address]  Date: 11/21/2019  Prepared by: Cameron Ali  Exercises   Supine Figure 4 Piriformis Stretch - 1 x daily - 7 x weekly - 2 sets - 2 reps - 30 second hold   Supine Hamstring Stretch with Strap - 1 x daily - 7 x weekly - 2 sets - 2 reps - 30 second hold   Cat-Camel - 1 x daily - 7 x weekly - 1 sets - 10 reps - 10 second hold   Prone Press Up on Elbows - 1 x daily - 7 x weekly - 1 sets - 10 reps - 10 second hold   Standing Lumbar Extension - 1 x daily - 7 x weekly - 1 sets - 10 reps - 10 second hold   Supine Sciatic Nerve Glide - 1 x daily - 7 x weekly - 1 sets - 30 reps - 1 second hold   Half Kneeling Hip Flexor Stretch - 1 x daily - 7 x weekly - 2 sets - 2 reps - 30 second hold  Dead Bug - 1 x daily - 7 x weekly - 2 sets - 10 reps - 2 second hold   Standard Plank - 1 x daily - 7 x weekly - 1 sets - 3 reps - 10 second hold   Side Plank on Elbow - 1 x daily - 7 x weekly - 2 sets - 2 reps - 10 second hold       Assessment:     Assessment:  Patient was able to correctly perform planks and side planks with proper form due to improved body awareness and improved core activation.  Patient tolerated all tx without complications.    Goals:  Short Term Goals:   1. By 12/01/2019, patient will be independent in HEP and symptom management.  2. By 12/01/2019, patient will report decrease pain to 3/10 on VAS at worst in order to be able to fall and stay asleep without difficulty without pain medication.  3. By 12/01/2019, patient will improve Oswestry by 90%.    Long Term Goals:  1. By 12/31/2019, patient will improve core and bilateral hip strength to 4+/5 MMT in order to be able to walk 3 miles without difficulty on even and uneven surfaces.  2. By 12/31/2019, patient will improve L/S ROM to 75% in all planes in order to be able  to perform supine to/from stand without difficulty.  3. By 12/31/2019, patient will improve pain level to 1/10 on VAS at worst in order to be able to lift 20 pounds floor to waist without difficulty.        Plan:     Therapeutic exercise, therapeutic activity, manual treatment, neuromuscular re-education, dry needling, modalities, HEP, and gait training.  Lower extremity and trunk ROM and strengthening.  Trunk, core, and pelvic stabilization.          Cameron Ali, PT, DPT  [place] #  [phone]

## 2019-12-11 ENCOUNTER — Ambulatory Visit
Admission: RE | Admit: 2019-12-11 | Discharge: 2019-12-11 | Disposition: A | Discharge status: Home / Self Care | Payer: No Typology Code available for payment source | Source: Ambulatory Visit

## 2019-12-11 NOTE — PT Progress Note (Signed)
Patient was called and reported that he forgot about his appointment today.  He is going to come to his appointment on Wednesday and cancel today.    Cameron Ali, PT, DPT  PT# [id number]

## 2019-12-13 ENCOUNTER — Ambulatory Visit
Admission: RE | Admit: 2019-12-13 | Discharge: 2019-12-13 | Disposition: A | Discharge status: Home / Self Care | Payer: No Typology Code available for payment source | Source: Ambulatory Visit

## 2019-12-13 NOTE — PT Progress Note (Signed)
[facility]  Department of Rehabilitation  [address]  [place], [place] [address]  Tel: [phone]                    Fax: [phone]      Outpatient Physical Therapy Treatment Note    Patient:  Jesse Berry MRN#: [id number]  Start of care: 10/31/2019    Referring MD: Dr. 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    Subjective:     Patient reports that he continues to get an occasional spike in pain level.  Pain (VAS): 1/10 today.  Location: L groin     Patient's medical condition is appropriate for Physical Therapy intervention at this time.      Objective:             Objective Measures This Visit:   No objective measures takent his visit.    Manual Treatment:  Grade II-III oscillatory flexion-rotation lumbar spine mobilizations and PA lumbar spine mobilizations to improve pain and ROM  Manual passive quadratus lumborum, and iliopsoas stretch to improve flexibility  STM and MFR to lumbo-sacral musculature to improve tissue extensibility, pain level, and blood flow     Treatment Activities:  Regular full plank 25 seconds X 2 to improve core strength.  Regular full side-plank 25 seconds 2 X each side to improve core strength.  Hand-heel rock 10 X 10 seconds to improve activation of spine into new range of motion.  Dead bug 10 X 10 seconds to improve core strength.   Alternating arm and leg in quadruped 10 X 10 seconds to improve core strength.  Piriformis muscle foam rolling 20 X each to improve flexibility.    Pt  Education:  Educated the patient on plan of care, goals of therapy, and progression of exercises.  Patient also encouraged to perform exercises regularly.    Current HEP:  Access Code: [email or web address]  URL: [email or web address]  Date: 11/21/2019  Prepared by: Cameron Ali  Exercises   Supine Figure 4 Piriformis Stretch - 1 x daily - 7 x weekly - 2 sets - 2 reps - 30 second hold   Supine Hamstring Stretch with Strap - 1 x daily - 7 x weekly - 2 sets - 2 reps - 30 second hold   Cat-Camel - 1 x daily - 7 x weekly - 1 sets - 10 reps - 10 second hold   Prone Press Up on Elbows - 1 x daily - 7 x weekly - 1 sets - 10 reps - 10 second hold   Standing Lumbar Extension - 1 x daily - 7 x weekly - 1 sets - 10 reps - 10 second hold   Supine Sciatic Nerve Glide - 1 x daily - 7 x weekly - 1 sets - 30 reps - 1 second hold   Half Kneeling Hip Flexor Stretch - 1 x daily - 7 x weekly - 2  sets - 2 reps - 30 second hold   Dead Bug - 1 x daily - 7 x weekly - 2 sets - 10 reps - 2 second hold   Standard Plank - 1 x daily - 7 x weekly - 1 sets - 3 reps - 10 second hold   Side Plank on Elbow - 1 x daily - 7 x weekly - 2 sets - 2 reps - 10 second hold       Assessment:     Assessment:  Patient was able to increase time for planks and side planks due to improvement in strength.  Patient continues to have tightness in L adductors and piriformis.    Goals:  Short Term Goals:   1. By 12/01/2019, patient will be independent in HEP and symptom management.  2. By 12/01/2019, patient will report decrease pain to 3/10 on VAS at worst in order to be able to fall and stay asleep without difficulty without pain medication.  3. By 12/01/2019, patient will improve Oswestry by 90%.    Long Term Goals:  1. By 12/31/2019, patient will improve core and bilateral hip strength to 4+/5 MMT in order to be able to walk 3 miles without difficulty on even and uneven surfaces.  2. By 12/31/2019, patient will improve L/S ROM to 75% in all planes in  order to be able to perform supine to/from stand without difficulty.  3. By 12/31/2019, patient will improve pain level to 1/10 on VAS at worst in order to be able to lift 20 pounds floor to waist without difficulty.        Plan:     Therapeutic exercise, therapeutic activity, manual treatment, neuromuscular re-education, dry needling, modalities, HEP, and gait training.  Lower extremity and trunk ROM and strengthening.  Trunk, core, and pelvic stabilization.          Cameron Ali, PT, DPT  [place] #  [id number]

## 2019-12-19 ENCOUNTER — Ambulatory Visit
Admission: RE | Admit: 2019-12-19 | Discharge: 2019-12-19 | Disposition: A | Discharge status: Home / Self Care | Payer: No Typology Code available for payment source | Source: Ambulatory Visit | Attending: Physician Assistant | Admitting: Physician Assistant

## 2019-12-19 DIAGNOSIS — M5126 Other intervertebral disc displacement, lumbar region: Secondary | ICD-10-CM | POA: Insufficient documentation

## 2019-12-19 DIAGNOSIS — M545 Low back pain: Secondary | ICD-10-CM | POA: Insufficient documentation

## 2019-12-19 NOTE — PT Progress Note (Signed)
[facility]  Department of Rehabilitation  [address]  [place], [place] [address]  Tel: [phone]                    Fax: [phone]      Outpatient Physical Therapy Treatment Note    Patient:  Jesse Berry MRN#: [id number]  Start of care: 10/31/2019    Referring MD: Dr. 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    Subjective:     Patient reports that he continues to have a limp with prolonged sit to stand.  Pain (VAS): 2/10 today.  Location: L groin     Patient's medical condition is appropriate for Physical Therapy intervention at this time.      Objective:             Objective Measures This Visit:   Improvement in core endurance.  Continued tightness in L QL and hip flexor.    Manual Treatment:  Grade II-III oscillatory flexion-rotation lumbar spine mobilizations and PA lumbar spine mobilizations to improve pain and ROM  Manual passive quadratus lumborum, hip IR, hip ER, adductor, hamstring, and iliopsoas stretch to improve flexibility  STM and MFR to lumbo-sacral musculature to improve tissue extensibility, pain level, and blood flow     Treatment Activities:  Regular full plank 30 seconds X 2 to improve core strength.  Regular full side-plank 30 seconds 2 X each side to improve core strength.  Hand-heel rock 10 X 10 seconds to improve activation of spine into new range of motion.  Dead bug 10 X 10 seconds to improve core strength.   Alternating arm and leg in quadruped 10 X 10 seconds to improve core  strength.  Piriformis muscle foam rolling 20 X each to improve flexibility.    Pt Education:  Educated the patient on plan of care, goals of therapy, and progression of exercises.  Patient also encouraged to perform exercises regularly.    Current HEP:  Access Code: [email or web address]  URL: [email or web address]  Date: 11/21/2019  Prepared by: Cameron Ali  Exercises   Supine Figure 4 Piriformis Stretch - 1 x daily - 7 x weekly - 2 sets - 2 reps - 30 second hold   Supine Hamstring Stretch with Strap - 1 x daily - 7 x weekly - 2 sets - 2 reps - 30 second hold   Cat-Camel - 1 x daily - 7 x weekly - 1 sets - 10 reps - 10 second hold   Prone Press Up on Elbows - 1 x daily - 7 x weekly - 1 sets - 10 reps - 10 second hold   Standing Lumbar Extension - 1 x daily - 7 x weekly - 1 sets - 10 reps - 10 second hold   Supine Sciatic Nerve Glide - 1 x daily - 7 x weekly - 1 sets - 30 reps - 1 second hold   Half  Kneeling Hip Flexor Stretch - 1 x daily - 7 x weekly - 2 sets - 2 reps - 30 second hold   Dead Bug - 1 x daily - 7 x weekly - 2 sets - 10 reps - 2 second hold   Standard Plank - 1 x daily - 7 x weekly - 1 sets - 3 reps - 10 second hold   Side Plank on Elbow - 1 x daily - 7 x weekly - 2 sets - 2 reps - 10 second hold   Piriformis Mobilization on Foam Roll - 1 x daily - 7 x weekly - 10 reps - 3 sets            Assessment:     Assessment:  Patient has improvement in core strength and endurance.  Patient continues to have increased tightness in L piriformis.  Patient was encouraged to foam roll it to relieve the tension.    Goals:  Short Term Goals:   1. By 12/01/2019, patient will be independent in HEP and symptom management.  2. By 12/01/2019, patient will report decrease pain to 3/10 on VAS at worst in order to be able to fall and stay asleep without difficulty without pain medication.  3. By 12/01/2019, patient will improve Oswestry by 90%.    Long Term Goals:  1. By 12/31/2019, patient will improve core  and bilateral hip strength to 4+/5 MMT in order to be able to walk 3 miles without difficulty on even and uneven surfaces.  2. By 12/31/2019, patient will improve L/S ROM to 75% in all planes in order to be able to perform supine to/from stand without difficulty.  3. By 12/31/2019, patient will improve pain level to 1/10 on VAS at worst in order to be able to lift 20 pounds floor to waist without difficulty.        Plan:     Therapeutic exercise, therapeutic activity, manual treatment, neuromuscular re-education, dry needling, modalities, HEP, and gait training.  Lower extremity and trunk ROM and strengthening.  Trunk, core, and pelvic stabilization.          Cameron Ali, PT, DPT  [place] #  [phone]

## 2019-12-21 ENCOUNTER — Ambulatory Visit
Admission: RE | Admit: 2019-12-21 | Discharge: 2019-12-21 | Disposition: A | Discharge status: Home / Self Care | Payer: No Typology Code available for payment source | Source: Ambulatory Visit

## 2019-12-21 NOTE — PT Progress Note (Signed)
[facility]  Department of Rehabilitation  [address]  [place], [place] [address]  Tel: [phone]                    Fax: [phone]      Outpatient Physical Therapy Treatment Note    Patient:  Jesse Berry MRN#: [id number]  Start of care: 10/31/2019    Referring MD: Dr. 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    Subjective:     Patient reports that he was able to use his foam roller to help with his left leg pain.  When patient gets up from a prolonged sitting position, his pain ranges from 2-10 on VAS.  Vv    Pain (VAS): 0/10 today.  Location: L groin     Patient's medical condition is appropriate for Physical Therapy intervention at this time.      Objective:             Objective Measures This Visit:   No objective measures taken this visit.    Manual Treatment:  Grade II-III oscillatory flexion-rotation lumbar spine mobilizations and PA lumbar spine mobilizations to improve pain and ROM  Manual passive quadratus lumborum, hip IR, hip ER, adductor, hamstring, and iliopsoas stretch to improve flexibility  STM and MFR to lumbo-sacral musculature to improve tissue extensibility, pain level, and blood flow     Treatment Activities:  Regular full plank 30 seconds X 3 to improve core strength.  Regular full side-plank 30 seconds 2 X each side to improve core strength.  Hand-heel rock 10 X 10 seconds to improve activation of spine into new range of motion.  Dead bug 10 X 10 seconds to improve core strength.   Alternating arm  and leg in quadruped 10 X 10 seconds to improve core strength.  Piriformis muscle foam rolling 20 X each to improve flexibility.    Pt Education:  Educated the patient on plan of care, goals of therapy, and progression of exercises.  Patient also encouraged to perform exercises regularly.    Current HEP:  Access Code: [email or web address]  URL: [email or web address]  Date: 11/21/2019  Prepared by: Cameron Ali  Exercises   Supine Figure 4 Piriformis Stretch - 1 x daily - 7 x weekly - 2 sets - 2 reps - 30 second hold   Supine Hamstring Stretch with Strap - 1 x daily - 7 x weekly - 2 sets - 2 reps - 30 second hold   Cat-Camel - 1 x daily - 7 x weekly - 1 sets - 10 reps - 10 second hold   Prone Press Up on Elbows - 1 x daily - 7 x weekly - 1 sets - 10 reps - 10 second hold   Standing Lumbar Extension - 1 x daily - 7 x weekly - 1 sets - 10 reps - 10 second hold   Supine Sciatic Nerve Glide - 1 x  daily - 7 x weekly - 1 sets - 30 reps - 1 second hold   Half Kneeling Hip Flexor Stretch - 1 x daily - 7 x weekly - 2 sets - 2 reps - 30 second hold   Dead Bug - 1 x daily - 7 x weekly - 2 sets - 10 reps - 2 second hold   Standard Plank - 1 x daily - 7 x weekly - 1 sets - 3 reps - 10 second hold   Side Plank on Elbow - 1 x daily - 7 x weekly - 2 sets - 2 reps - 10 second hold   Piriformis Mobilization on Foam Roll - 1 x daily - 7 x weekly - 10 reps - 3 sets         Assessment:     Assessment:  Patient needed min VC's for proper position of spine during planks.  Patient tolerated all tx without complications.    Goals:  Short Term Goals:   1. By 12/01/2019, patient will be independent in HEP and symptom management.  2. By 12/01/2019, patient will report decrease pain to 3/10 on VAS at worst in order to be able to fall and stay asleep without difficulty without pain medication.  3. By 12/01/2019, patient will improve Oswestry by 90%.    Long Term Goals:  1. By 12/31/2019, patient will improve core and bilateral hip  strength to 4+/5 MMT in order to be able to walk 3 miles without difficulty on even and uneven surfaces.  2. By 12/31/2019, patient will improve L/S ROM to 75% in all planes in order to be able to perform supine to/from stand without difficulty.  3. By 12/31/2019, patient will improve pain level to 1/10 on VAS at worst in order to be able to lift 20 pounds floor to waist without difficulty.        Plan:     Therapeutic exercise, therapeutic activity, manual treatment, neuromuscular re-education, dry needling, modalities, HEP, and gait training.  Lower extremity and trunk ROM and strengthening.  Trunk, core, and pelvic stabilization.          Cameron Ali, PT, DPT  [place] #  [phone]

## 2019-12-26 ENCOUNTER — Ambulatory Visit
Admission: RE | Admit: 2019-12-26 | Discharge: 2019-12-26 | Disposition: A | Discharge status: Home / Self Care | Payer: No Typology Code available for payment source | Source: Ambulatory Visit

## 2019-12-26 NOTE — PT Progress Note (Signed)
[facility]  Department of Rehabilitation  [address]  [place], [place] [address]  Tel: [phone]                    Fax: [phone]      Outpatient Physical Therapy Treatment Note    Patient:  Jesse Berry MRN#: [id number]  Start of care: 10/31/2019    Referring MD: Dr. 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    Subjective:     Patient reports that he is feeing good today.  He is unable to sit with his left leg crossed over the right due to pain.  Pain (VAS): 0/10 today.  Location: L groin     Patient's medical condition is appropriate for Physical Therapy intervention at this time.      Objective:             Objective Measures This Visit:   Increased tension in L piriformis.  Decreased flexibility in L adductor.          Manual Treatment:  Grade II-III oscillatory flexion-rotation lumbar spine mobilizations and PA lumbar spine mobilizations to improve pain and ROM  Manual passive quadratus lumborum, hip IR, hip ER, adductor, hamstring, and iliopsoas stretch to improve flexibility  STM and MFR to lumbo-sacral musculature to improve tissue extensibility, pain level, and blood flow     Treatment Activities:  Regular full plank 30 seconds X 3 to improve core strength.  Regular full side-plank 30 seconds 2 X each side to improve core strength.  Hand-heel rock 10 X 10 seconds to improve activation of spine into new range of motion.  Dead bug 10 X 10 seconds to improve core strength.   Alternating arm and leg in quadruped 10 X  10 seconds to improve core strength.     Pt Education:  Educated the patient on plan of care, goals of therapy, and progression of exercises.  Patient also encouraged to perform exercises regularly.    Current HEP:  Access Code: [email or web address]  URL: [email or web address]  Date: 11/21/2019  Prepared by: Cameron Ali  Exercises   Supine Figure 4 Piriformis Stretch - 1 x daily - 7 x weekly - 2 sets - 2 reps - 30 second hold   Supine Hamstring Stretch with Strap - 1 x daily - 7 x weekly - 2 sets - 2 reps - 30 second hold   Cat-Camel - 1 x daily - 7 x weekly - 1 sets - 10 reps - 10 second hold   Prone Press Up on Elbows - 1 x daily - 7 x weekly - 1 sets - 10 reps - 10 second hold   Standing Lumbar Extension - 1 x daily - 7 x weekly - 1 sets - 10 reps - 10 second hold   Supine Sciatic Nerve Glide - 1 x daily - 7 x weekly - 1 sets - 30 reps - 1  second hold   Half Kneeling Hip Flexor Stretch - 1 x daily - 7 x weekly - 2 sets - 2 reps - 30 second hold   Dead Bug - 1 x daily - 7 x weekly - 2 sets - 10 reps - 2 second hold   Standard Plank - 1 x daily - 7 x weekly - 1 sets - 3 reps - 10 second hold   Side Plank on Elbow - 1 x daily - 7 x weekly - 2 sets - 2 reps - 10 second hold   Piriformis Mobilization on Foam Roll - 1 x daily - 7 x weekly - 10 reps - 3 sets      Assessment:     Assessment:  Patient's exercises were not progressed due to continued difficulty with current program.  Patient continues to have difficulty with planks due to poor core endurance.  HEP updated to include alternating arm and leg.    Goals:  Short Term Goals:   1. By 12/01/2019, patient will be independent in HEP and symptom management.  2. By 12/01/2019, patient will report decrease pain to 3/10 on VAS at worst in order to be able to fall and stay asleep without difficulty without pain medication.  3. By 12/01/2019, patient will improve Oswestry by 90%.    Long Term Goals:  1. By 12/31/2019, patient will improve core and  bilateral hip strength to 4+/5 MMT in order to be able to walk 3 miles without difficulty on even and uneven surfaces.  2. By 12/31/2019, patient will improve L/S ROM to 75% in all planes in order to be able to perform supine to/from stand without difficulty.  3. By 12/31/2019, patient will improve pain level to 1/10 on VAS at worst in order to be able to lift 20 pounds floor to waist without difficulty.        Plan:     Therapeutic exercise, therapeutic activity, manual treatment, neuromuscular re-education, dry needling, modalities, HEP, and gait training.  Lower extremity and trunk ROM and strengthening.  Trunk, core, and pelvic stabilization.          Cameron Ali, PT, DPT  [place] #  [phone]

## 2019-12-28 ENCOUNTER — Ambulatory Visit
Admission: RE | Admit: 2019-12-28 | Discharge: 2019-12-28 | Disposition: A | Discharge status: Home / Self Care | Payer: No Typology Code available for payment source | Source: Ambulatory Visit

## 2019-12-28 NOTE — PT Progress Note (Signed)
[facility]  Department of Rehabilitation  [address]  [place], [place] [address]  Tel: [phone]                    Fax: [phone]      Outpatient Physical Therapy Treatment Note    Patient:  Jesse Berry MRN#: [id number]  Start of care: 10/31/2019    Referring MD: Dr. 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    Subjective:     Patient reports soreness and stiffness in his L groin today.  Patient first started to feel this pain as he was rushing across the street.  Pain (VAS): 2-3/10 today.  Location: L groin     Patient's medical condition is appropriate for Physical Therapy intervention at this time.      Objective:             Objective Measures This Visit:   Improvement in hip flexor flexibility.    Manual Treatment:  Grade II-III oscillatory flexion-rotation lumbar spine mobilizations and PA lumbar spine mobilizations to improve pain and ROM  Manual passive quadratus lumborum, hip IR, hip ER, adductor, hamstring, and iliopsoas stretch to improve flexibility  STM and MFR to lumbo-sacral musculature to improve tissue extensibility, pain level, and blood flow     Treatment Activities:  Regular full plank 30 seconds X 3 to improve core strength with arm and leg lifts.  Regular full side-plank 30 seconds 2 X each side to improve core strength.  Hand-heel rock 10 X 10 seconds to improve activation of spine into new range of motion.  Alternating arm and leg in quadruped 10 X 10 seconds to improve core strength.    Held today:  time constraint  Dead bug 10 X 10 seconds to improve core strength.      Pt Education:  Educated the patient on plan of care, goals of therapy, and progression of exercises.  Patient also encouraged to perform exercises regularly.    Current HEP:  Access Code: [email or web address]  URL: [email or web address]  Date: 11/21/2019  Prepared by: Cameron Ali  Exercises   Supine Figure 4 Piriformis Stretch - 1 x daily - 7 x weekly - 2 sets - 2 reps - 30 second hold   Supine Hamstring Stretch with Strap - 1 x daily - 7 x weekly - 2 sets - 2 reps - 30 second hold   Cat-Camel - 1 x daily - 7 x weekly - 1 sets - 10 reps - 10 second hold   Prone Press Up on Elbows - 1 x daily - 7 x weekly - 1 sets - 10 reps - 10 second hold   Standing Lumbar Extension - 1 x daily - 7 x weekly - 1 sets - 10 reps - 10 second hold   Supine Sciatic Nerve Glide - 1 x daily - 7 x weekly - 1 sets - 30 reps - 1  second hold   Half Kneeling Hip Flexor Stretch - 1 x daily - 7 x weekly - 2 sets - 2 reps - 30 second hold   Dead Bug - 1 x daily - 7 x weekly - 2 sets - 10 reps - 2 second hold   Standard Plank - 1 x daily - 7 x weekly - 1 sets - 3 reps - 10 second hold   Side Plank on Elbow - 1 x daily - 7 x weekly - 2 sets - 2 reps - 10 second hold   Piriformis Mobilization on Foam Roll - 1 x daily - 7 x weekly - 10 reps - 3 sets      Assessment:     Assessment:  Patient was encouraged to start speed walking to improve ability to walk fast like when crossing the street.  Patient was able to perform hand and toe taps during planks due to improvement in core strength.    Goals:  Short Term Goals:   1. By 12/01/2019, patient will be independent in HEP and symptom management.  2. By 12/01/2019, patient will report decrease pain to 3/10 on VAS at worst in order to be able to fall and stay asleep without difficulty without pain medication.  3. By 12/01/2019, patient will improve Oswestry by 90%.    Long Term Goals:  1. By 12/31/2019, patient will  improve core and bilateral hip strength to 4+/5 MMT in order to be able to walk 3 miles without difficulty on even and uneven surfaces.  2. By 12/31/2019, patient will improve L/S ROM to 75% in all planes in order to be able to perform supine to/from stand without difficulty.  3. By 12/31/2019, patient will improve pain level to 1/10 on VAS at worst in order to be able to lift 20 pounds floor to waist without difficulty.        Plan:     Therapeutic exercise, therapeutic activity, manual treatment, neuromuscular re-education, dry needling, modalities, HEP, and gait training.  Lower extremity and trunk ROM and strengthening.  Trunk, core, and pelvic stabilization.          Cameron Ali, PT, DPT  [place] #  [phone]

## 2020-01-02 ENCOUNTER — Ambulatory Visit
Admission: RE | Admit: 2020-01-02 | Discharge: 2020-01-02 | Disposition: A | Discharge status: Home / Self Care | Payer: No Typology Code available for payment source | Source: Ambulatory Visit

## 2020-01-02 NOTE — PT Progress Note (Signed)
[facility]  Outpatient Rehabilitation Services  [address]  [address] 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    SUBJECTIVE:    Patient stated history:In February of 2021, patient was movingfurniture and a mattressand felt pain in his L/S. Patient is an avid runner and has been unable to run since his injury. Patient has N/T on the plantar surface of hisgreat toein hisL foot. Patient has decreased sensation in his anterior R thigh. Patient feels like he is unsteady on his feet as well. Patient reports that his R thigh N/T is consistent in nature.His L toe N/T is intermittent.His L leg also has a shooting sensation from hisgroinand lateral thigh. Patient reports that his pain is consistent throughout the day. Patient has a desk job but sitting with a chair with lumbar supportdoes not irritate his back.  11/28/2019:  Patient reports that he feels like he is doing better overall.  Patient reports that he no longer has N/T in his great toe.  He continues to have a "very slight" decrease in sensation in his L anterior thigh.  Patient reports that his pain in his L groin is intermittent in nature.  Patient does not have a f/u with his MD at this time.  Patient is able to sit for as long as he would like with proper posture but does get discomfort  when he slouches.  Patient no longer is having difficulty with falling or staying asleep due to pain.  Patient no longer has difficulty with supine to/from sit, with bending, or with lifting.  Patient no longer is having pain with twisting.  01/02/2020:   Patient reports that he no longer is getting N/T in his L though or toe.  Patient is able to walk for 60 minutes without difficulty.    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  Medications:FLEXERIL, Meloxicam, Flonase, Epipen, Cetirizine, Symbicort, Albuterol.  Prior level of function: Independent.  Cognition: Oriented to time, place and person.  Functional Limitations:  Sit to/from stand (soreness in L hip)  Pain:  Location: L/S  Type:aching  Level on VAS: Current0/10Worst:3/10Best:0/10  Patient's stated goals: "To be able to walk 3 miles. To get rid of the pain."    OBJECTIVE:    Oswestry:28%  Observation:truncal obesity,decreased lumbar lordosis.  Posture: forward flexed trunk  Gait:Forward flexed trunk  Imaging:   Taken from 09/15/2019 MRI:  1. L2-L3 3 mm broad protruded disc herniation, which may cause right L3 nerve root impingement.  2. L4-L5 left foraminal protruded disc herniation which may cause left L4 nerve root impingement.  Assistive Device: No    Lumbar ROM (Degrees) L SPINE   Forward Bending 75%   Backward Bending 50%     Side Bending Left 75%   Side Bending Right 75%   Rotation Left 75%  Rotation Right 75% + pain     Strength (Manual Muscle      Test) R L   Iliopsoas (L2) 5 5   Quadriceps (L3) 5 5   Tibialis Anterior (L4) 5 5   EHL (L5) 5 5   Peroneus L &B (S1) 5 5   Gluts 5 5   Hamstrings (S1,2) 5 5   Hip ER 4 4   Hip IR 4+ 4+     Able to hold abdominal bracewith bilateral SLR: 30 seconds    Sensation (Light Touch):  Dermatome R L    L2 WNL WNL   L3 WNL WNL   L4 WNL dec   L5 WNL WNL   S1 WNL WNL   S2 WNL WNL   Reflexes:  Knee Jerk (L3-L4):0R, 0L  Ankle Jerk (S1-S2):0R, 0L  Special Tests:   Pelvic landmarks in standing: equal  Gilette's Test: negative bilaterally  Slump:+L, -R  Supine to Sit Test: negative  SLR:-R, - L  FABER:- L, - R  Prone Knee bend: negative  Ely's Test: positive for tightness in bilateral hip flexors  Pelvic landmarks in supine: equal  Palpation:Tenderness to palpation of L QL, L piriformis, L glutes, andincreased tension in erector spinae throughout L/S.  Joint Mobility:Decreased mobility T10-L3, increased mobility of L4-S1    ASSESSMENT:  Patient's L/S ROM also has improvement but patient does continue to have pain with R rotation.  Patient has full functional ROM in all planes in L/S.  Patient is now able to walk for 60 minutes without pain and randomly.  He has improvement in strength in B hips and core but does not yet have full strength.  Patient's pain level has improved.  Pt will benefit from continued skilled PT to address deficits and improve function.  However, discharge is probable in 3 visits.    Goals:  Goals:  Short Term Goals:   1. By4/16/2021, patient will be independent in HEP and symptom management: 90% met.  2. By4/16/2021, patient will report decrease pain to 3/10 on VAS at worst in order to be able tofall and stay asleep without difficulty without pain medication: 100% met.  3. By4/16/2021, patient will improve Oswestry by 90%: 50% met.    Long Term Goals:  1. By5/16/2021, patient will improve core and bilateral hip strength to 4+/5 MMT in order to be able towalk 3 miles without difficulty on even and uneven surfaces: 90% met.  2. By5/16/2021, patient will improve L/S ROM to 75% in all planes in order to be able toperform supine to/from stand without difficulty: 75% met.  3. By5/16/2021, patient will improve pain level to 1/10 on VAS at worst in order to be able tolift 20  pounds floor to waist without difficulty: 50% met.    Goal potential/prognosis: good    INTERVENTION:  Educated the patient to role of physical therapy, plan of care, goals of therapy and HEP.                          Manual Treatment:                          Grade II-III oscillatory flexion-rotation lumbar spine mobilizations and PA lumbar spine mobilizations to improve pain and ROM                          Manual passive quadratus lumborum,and  iliopsoas stretch to improve flexibility                          STM and MFR to lumbo-sacral musculature to improve tissue extensibility, pain level, and blood flow                      Patient Education: HEP.  HEP Medbridge:                          Current HEP:                          Access Code: [email or web address]                          URL: [email or web address]                          Date: 11/21/2019                          Prepared by: Cameron Ali                          Exercises                          Supine Figure 4 Piriformis Stretch - 1 x daily - 7 x weekly - 2 sets - 2 reps - 30 second hold                          Supine Hamstring Stretch with Strap - 1 x daily - 7 x weekly - 2 sets - 2 reps - 30 second hold                          Cat-Camel - 1 x daily - 7 x weekly - 1 sets - 10 reps - 10 second hold                          Prone Press Up on Elbows - 1 x daily - 7 x weekly - 1 sets - 10 reps - 10 second hold                          Standing Lumbar Extension - 1 x daily - 7 x weekly - 1 sets - 10 reps - 10 second hold                          Supine Sciatic Nerve Glide - 1 x daily - 7 x weekly - 1 sets - 30 reps - 1 second hold                          Half Kneeling Hip Flexor Stretch - 1 x daily - 7 x weekly - 2 sets - 2 reps - 30 second hold                          Dead Bug - 1 x daily - 7 x weekly -  2 sets - 10 reps - 2 second hold                           Standard Plank - 1 x daily - 7 x weekly - 1 sets - 3 reps - 10 second hold                          Side Plank       PLAN:  Pt will be seen1x week for 4 more visits for manual therapy, therapeutic exercises, neuromuscular re-education, therapeutic activities, dry needling, home exercise program, and modalities as needed.  Pt will continue with HEP as instructed.      Cameron Ali, PT, DPT  [place] # [phone]

## 2020-01-02 NOTE — PT Plan of Care Note (Signed)
[facility] Outpatient Physical Therapy  [address]  [place], [place] [address]  [phone]    Physical Therapy Plan of Care        Patient: Jesse Berry       MRN: [id number]   Date of Birth: Dec 28, 1965  Age: 54 y.o.    Physician Agreement of Patient Plan of Care:   Date: 01/02/2020    Dear Trudee Kuster Conley Rolls, PA,    Thank you for allowing [name] to participate in the care of [name]. Enclosed is the Plan of Care for Physical Therapy for this patient.     Regulations from the Center for Medicare and Medicaid Services (CMS) require your review and approval of this plan of care. Please note that since a prescription or order for therapy does not legally constitute approval of plan of care, we will not be able to proceed with therapy until we receive your approval of the plan of care.     Please sign and date the Physician Certification Statement below and fax this document back to [name] within 3 business days of receipt so we can initiate the therapy treatment plan.       Sincerely,      Elon Jester  Outpatient Clinical Manager                           Senior Director  Rehabilitation [facility]    Physician Certification  I have read and agreed with the plan of care for the above named patient who is under my care.     Medical Diagnosis:Lumbar disc herniation M51.26, arthritis of left hip M16.12    PT Diagnosis: Increased pain in lumbar spine     Frequency: 1 time per week for 4 visits    Certification Period: 01/02/2020 - 03/03/2020        Physician Signature: ________________________________ Date: __________    Physician NPI: _____________________________    Please fax completed document to [facility], Department of Rehabilitation at [phone].      Treatment Plan:     Goals:  Short Term Goals:   1. By4/16/2021, patient will be independent in HEP and symptom  management: 90% met.  2. By4/16/2021, patient will report decrease pain to 3/10 on VAS at worst in order to be able tofall and stay asleep without difficulty without pain medication: 100% met.  3. By4/16/2021, patient will improve Oswestry by 90%: 50% met.    Long Term Goals:  1. By5/16/2021, patient will improve core and bilateral hip strength to 4+/5 MMT in order to be able towalk 3 miles without difficulty on even and uneven surfaces: 90% met.  2. By5/16/2021, patient will improve L/S ROM to 75% in all planes in order to be able toperform supine to/from stand without difficulty: 75% met.  3. By5/16/2021, patient will improve pain level to 1/10 on VAS at worst in order to be able tolift 20 pounds  floor to waist without difficulty: 50% met.    Goal potential/prognosis: good    Assessment:     Patient's L/S ROM also has improvement but patient does continue to have pain with R rotation.  Patient has full functional ROM in all planes in L/S. Patient is now able to walk for 60 minutes without pain and randomly.  He has improvement in strength in B hips and core but does not yet have full strength.  Patient's pain level has improved.  Pt will benefit from continued skilled PT to address deficits and improve function.However, discharge is probable in 3 visits.    Cameron Ali PT, DPT  PT# [id number]

## 2020-01-04 ENCOUNTER — Ambulatory Visit: Payer: No Typology Code available for payment source

## 2020-01-09 ENCOUNTER — Ambulatory Visit
Admission: RE | Admit: 2020-01-09 | Discharge: 2020-01-09 | Disposition: A | Discharge status: Home / Self Care | Payer: No Typology Code available for payment source | Source: Ambulatory Visit

## 2020-01-09 NOTE — PT Progress Note (Signed)
[facility]  Department of Rehabilitation  [address]  [place], [place] [address]  Tel: [phone]                    Fax: [phone]      Outpatient Physical Therapy Treatment Note    Patient:  Jesse Berry MRN#: [id number]  Start of care: 10/31/2019    Referring MD: Dr. Kenna Gilbert    Medical Diagnosis: Lumbar disc herniation M51.26, arthritis of left hip M16.12  PT Diagnosis: Increased pain in lumbar spine     Certification Period: 10/31/2019-12/31/2019    Treatment #: 18 of 20     TREATMENT min / units   Ther Ex 0 min   Manual Tx 30 min   Neuro re-ed 15 min     Precautions and Contraindications: None  Fall Risk: No  Allergies: No known allergies  Change in Medications: No  Change in medical status: No    Patient and therapist wore surgical masks throughout treatment session.    Subjective:     Patient reports that he found a Pilates place and plans to start once his PT sessions are over.  Patient is getting his COVID-19 vaccine tomorrow.  Pain (VAS): 1/10 today.  Location: L groin     Patient's medical condition is appropriate for Physical Therapy intervention at this time.      Objective:             Objective Measures This Visit:   Patient has improvement in core endurance.    Manual Treatment:  Grade II-III oscillatory flexion-rotation lumbar spine mobilizations and PA lumbar spine mobilizations to improve pain and ROM  Manual passive quadratus lumborum, hip IR, hip ER, adductor, hamstring, and iliopsoas stretch to improve flexibility  STM and MFR to lumbo-sacral musculature to improve tissue extensibility, pain level, and blood flow     Treatment Activities:  Regular full plank 30 seconds X 3 to improve core strength with arm and leg lifts.  Regular full side-plank 30 seconds 2 X each side to improve core strength.  Hand-heel rock 10 X 10 seconds to improve activation of spine into new range of motion.  Alternating arm and leg in quadruped 10 X 10 seconds to improve core  strength.  Dead bug 10 X 10 seconds to improve core strength.      Pt Education:  Educated the patient on plan of care, goals of therapy, and progression of exercises.  Patient also encouraged to perform exercises regularly.    Current HEP:  Access Code: [email or web address]  URL: [email or web address]  Date: 11/21/2019  Prepared by: Cameron Ali  Exercises   Supine Figure 4 Piriformis Stretch - 1 x daily - 7 x weekly - 2 sets - 2 reps - 30 second hold   Supine Hamstring Stretch with Strap - 1 x daily - 7 x weekly - 2 sets - 2 reps - 30 second hold   Cat-Camel - 1 x daily - 7 x weekly - 1 sets - 10 reps - 10 second hold   Prone Press Up on Elbows - 1 x daily - 7 x weekly - 1 sets - 10 reps - 10 second hold   Standing Lumbar Extension - 1 x daily - 7 x weekly - 1 sets - 10 reps - 10 second hold   Supine Sciatic Nerve Glide - 1 x daily - 7 x weekly - 1 sets - 30 reps - 1 second hold   Half  Kneeling Hip Flexor Stretch - 1 x daily - 7 x weekly - 2 sets - 2 reps - 30 second hold   Dead Bug - 1 x daily - 7 x weekly - 2 sets - 10 reps - 2 second hold   Standard Plank - 1 x daily - 7 x weekly - 1 sets - 3 reps - 10 second hold   Side Plank on Elbow - 1 x daily - 7 x weekly - 2 sets - 2 reps - 10 second hold   Piriformis Mobilization on Foam Roll - 1 x daily - 7 x weekly - 10 reps - 3 sets      Assessment:     Assessment:  Patient was able to perform dead bug without taking a break in-between repetitions due to improvement in core strength and endurance.    Goals:  Short Term Goals:   1. By 12/01/2019, patient will be independent in HEP and symptom management.  2. By 12/01/2019, patient will report decrease pain to 3/10 on VAS at worst in order to be able to fall and stay asleep without difficulty without pain medication.  3. By 12/01/2019, patient will improve Oswestry by 90%.    Long Term Goals:  1. By 12/31/2019, patient will improve core and bilateral hip strength to 4+/5 MMT in order to be able to walk 3  miles without difficulty on even and uneven surfaces.  2. By 12/31/2019, patient will improve L/S ROM to 75% in all planes in order to be able to perform supine to/from stand without difficulty.  3. By 12/31/2019, patient will improve pain level to 1/10 on VAS at worst in order to be able to lift 20 pounds floor to waist without difficulty.        Plan:     Therapeutic exercise, therapeutic activity, manual treatment, neuromuscular re-education, dry needling, modalities, HEP, and gait training.  Lower extremity and trunk ROM and strengthening.  Trunk, core, and pelvic stabilization.          Cameron Ali, PT, DPT  [place] #  [phone]

## 2020-01-11 ENCOUNTER — Ambulatory Visit: Payer: No Typology Code available for payment source

## 2020-01-16 ENCOUNTER — Ambulatory Visit
Admission: RE | Admit: 2020-01-16 | Discharge: 2020-01-16 | Disposition: A | Discharge status: Home / Self Care | Payer: No Typology Code available for payment source | Source: Ambulatory Visit | Attending: Physician Assistant | Admitting: Physician Assistant

## 2020-01-16 DIAGNOSIS — M5126 Other intervertebral disc displacement, lumbar region: Secondary | ICD-10-CM | POA: Insufficient documentation

## 2020-01-16 DIAGNOSIS — M545 Low back pain: Secondary | ICD-10-CM | POA: Insufficient documentation

## 2020-01-16 NOTE — PT Progress Note (Signed)
[facility]  Department of Rehabilitation  [address]  [place], [place] [address]  Tel: [phone]                    Fax: [phone]      Outpatient Physical Therapy Treatment Note    Patient:  Jesse Berry MRN#: [id number]  Start of care: 10/31/2019    Referring MD: Dr. Kenna Gilbert    Medical Diagnosis: Lumbar disc herniation M51.26, arthritis of left hip M16.12  PT Diagnosis: Increased pain in lumbar spine     Certification Period: 10/31/2019-12/31/2019    Treatment #: 19 of 20     TREATMENT min / units   Ther Ex 0 min   Manual Tx 30 min   Neuro re-ed 15 min     Precautions and Contraindications: None  Fall Risk: No  Allergies: No known allergies  Change in Medications: No  Change in medical status: No    Patient and therapist wore surgical masks throughout treatment session.    Subjective:     Patient reports that he got his first COVID-19 vaccine.  Patient has no pain today.  Patient's next MD appointment is tomorrow with Dr. Conley Rolls.  Pain (VAS): 0/10 today.  Location: L groin     Patient's medical condition is appropriate for Physical Therapy intervention at this time.      Objective:             Objective Measures This Visit:   Increased tension in L piriformis, improvement with manual tx.    Manual Treatment:  Grade II-III oscillatory flexion-rotation lumbar spine mobilizations and PA lumbar spine mobilizations to improve pain and ROM  Manual passive quadratus lumborum, hip IR, hip ER, adductor, hamstring, and iliopsoas stretch to improve flexibility  STM and MFR to lumbo-sacral musculature to improve tissue extensibility, pain level, and blood flow     Treatment Activities:  Regular full plank 30 seconds X 3 to improve core strength with arm and leg lifts.  Regular full side-plank 30 seconds 2 X each side to improve core strength.  Hand-heel rock 10 X 10 s     econds to improve activation of spine into new range of motion.  Alternating arm and leg in quadruped 10 X 10 seconds to improve  core strength.  Dead bug 10 X 10 seconds to improve core strength.      Pt Education:  Educated the patient on plan of care, goals of therapy, and progression of exercises.  Patient also encouraged to perform exercises regularly.    Current HEP:  Access Code: [email or web address]  URL: [email or web address]  Date: 11/21/2019  Prepared by: Cameron Ali  Exercises   Supine Figure 4 Piriformis Stretch - 1 x daily - 7 x weekly - 2 sets - 2 reps - 30 second hold   Supine Hamstring Stretch with Strap - 1 x daily - 7 x weekly - 2 sets - 2 reps - 30 second hold   Cat-Camel - 1 x daily - 7 x weekly - 1 sets - 10 reps - 10 second hold   Prone Press Up on Elbows - 1 x daily - 7 x weekly - 1 sets - 10 reps - 10 second hold   Standing Lumbar Extension - 1 x daily - 7 x weekly - 1 sets - 10 reps - 10 second hold   Supine Sciatic Nerve Glide - 1 x daily - 7 x weekly - 1 sets - 30 reps -  1 second hold   Half Kneeling Hip Flexor Stretch - 1 x daily - 7 x weekly - 2 sets - 2 reps - 30 second hold   Dead Bug - 1 x daily - 7 x weekly - 2 sets - 10 reps - 2 second hold   Standard Plank - 1 x daily - 7 x weekly - 1 sets - 3 reps - 10 second hold   Side Plank on Elbow - 1 x daily - 7 x weekly - 2 sets - 2 reps - 10 second hold   Piriformis Mobilization on Foam Roll - 1 x daily - 7 x weekly - 10 reps - 3 sets      Assessment:     Assessment:  Patient required less break time between planks due to improved core endurance.  Discharge next visit.    Goals:  Short Term Goals:   1. By 12/01/2019, patient will be independent in HEP and symptom management.  2. By 12/01/2019, patient will report decrease pain to 3/10 on VAS at worst in order to be able to fall and stay asleep without difficulty without pain medication.  3. By 12/01/2019, patient will improve Oswestry by 90%.    Long Term Goals:  1. By 12/31/2019, patient will improve core and bilateral hip strength to 4+/5 MMT in order to be able to walk 3 miles without difficulty  on even and uneven surfaces.  2. By 12/31/2019, patient will improve L/S ROM to 75% in all planes in order to be able to perform supine to/from stand without difficulty.  3. By 12/31/2019, patient will improve pain level to 1/10 on VAS at worst in order to be able to lift 20 pounds floor to waist without difficulty.        Plan:     Therapeutic exercise, therapeutic activity, manual treatment, neuromuscular re-education, dry needling, modalities, HEP, and gait training.  Lower extremity and trunk ROM and strengthening.  Trunk, core, and pelvic stabilization.          Cameron Ali, PT, DPT  [place] #  [phone]

## 2020-01-17 ENCOUNTER — Encounter (INDEPENDENT_AMBULATORY_CARE_PROVIDER_SITE_OTHER): Payer: Self-pay | Admitting: Physician Assistant

## 2020-01-17 ENCOUNTER — Ambulatory Visit (INDEPENDENT_AMBULATORY_CARE_PROVIDER_SITE_OTHER): Payer: No Typology Code available for payment source | Admitting: Physician Assistant

## 2020-01-17 VITALS — BP 112/80 | Temp 98.0°F | Ht 72.0 in | Wt 245.0 lb

## 2020-01-17 DIAGNOSIS — Z1211 Encounter for screening for malignant neoplasm of colon: Secondary | ICD-10-CM

## 2020-01-17 DIAGNOSIS — M5126 Other intervertebral disc displacement, lumbar region: Secondary | ICD-10-CM

## 2020-01-17 DIAGNOSIS — Z91018 Allergy to other foods: Secondary | ICD-10-CM

## 2020-01-17 DIAGNOSIS — W57XXXA Bitten or stung by nonvenomous insect and other nonvenomous arthropods, initial encounter: Secondary | ICD-10-CM

## 2020-01-17 MED ORDER — EPIPEN 2-PAK 0.3 MG/0.3ML IJ SOAJ
0.30 mg | Freq: Once | INTRAMUSCULAR | 3 refills | Status: AC | PRN
Start: 2020-01-17 — End: 2020-01-17

## 2020-01-17 MED ORDER — TRIAMCINOLONE ACETONIDE 0.1 % EX OINT
TOPICAL_OINTMENT | Freq: Two times a day (BID) | CUTANEOUS | 1 refills | Status: AC
Start: 2020-01-17 — End: 2020-01-31

## 2020-01-17 NOTE — Progress Notes (Addendum)
[facility] INTERNAL MEDICINE - AN [place] [employer]NER                  Date of Exam: 01/17/2020 4:32 PM        Patient ID: Jesse Berry is a 54 y.o. male.        Chief Complaint:    Chief Complaint   Patient presents with    Bug bite on upper torso     arms, legs x4-6 wks and refill Epipen, and pinched nerves on back x2 months              HPI:    Lower back and left hip pain much better since started PT 2 months ago. PT ends next wk. Plans to start Pilate to help strengthen left hip. Hasn't had to use Flexeril or Meloxicam in weeks.     Living at current apartment since 02/2019. Noted various different kind of bugs, including flea, in his apt. Had inspection for bed bug and none found.     Have multiple food allergies. Hives and SOB w/ shellfish. Potatoes cause throat to close up. Milk result in "closing up esophagus". Also allergic to wheat/egg/yeast  Rash  This is a new problem. Episode onset: 4-6 wks. The problem is unchanged. The affected locations include the neck, left arm, right arm, right lower leg and left lower leg. The rash is characterized by redness and itchiness. He was exposed to an insect bite/sting. Pertinent negatives include no fatigue, fever, joint pain or shortness of breath. Past treatments include nothing.             Problem List:    Patient Active Problem List   Diagnosis    Moderate persistent asthma    Multiple food allergies    Schatzki's ring of distal esophagus    History of anxiety    Left hip pain    Lumbar herniated disc             Current Meds:    Current Outpatient Medications   Medication Sig Dispense Refill    albuterol sulfate HFA (PROVENTIL) 108 (90 Base) MCG/ACT inhaler Inhale 2 puffs into the lungs as needed      budesonide-formoterol (SYMBICORT) 160-4.5 MCG/ACT inhaler Inhale 2 puffs into the lungs 2 (two) times daily 3 Inhaler 3    cetirizine (ZyrTEC) 10 MG tablet Take 10 mg by mouth daily      EPINEPHrine (EPIPEN 2-PAK) 0.3 MG/0.3ML Solution Auto-injector  injection Inject 0.3 mLs (0.3 mg total) into the muscle once as needed (food allergy) 1 Device 3    fluticasone (FLONASE) 50 MCG/ACT nasal spray 1 spray by Nasal route daily      meloxicam (Mobic) 7.5 MG tablet Take 1 tablet (7.5 mg total) by mouth daily as needed for Pain (Take with food) 30 tablet 0    triamcinolone (KENALOG) 0.1 % ointment Apply topically 2 (two) times daily for 14 days 30 g 1     No current facility-administered medications for this visit.           Allergies:    Allergies   Allergen Reactions    Shellfish-Derived Products             Social History:    Social History     Occupational History    Occupation: Airline pilot   Tobacco Use    Smoking status: Never Smoker    Smokeless tobacco: Never Used   Substance and Sexual Activity    Alcohol use:  Never    Drug use: Never    Sexual activity: Not on file           The following sections were reviewed this encounter by the provider:   Tobacco   Allergies   Meds   Problems   Med Hx   Surg Hx   Fam Hx              Vital Signs:    Vitals:    01/17/20 1559   BP: 112/80   BP Site: Right arm   Patient Position: Sitting   Temp: 98 F (36.7 C)   TempSrc: Temporal   Weight: 111.1 kg (245 lb)   Height: 1.829 m (6')            ROS:    Review of Systems   Constitutional: Negative for fatigue and fever.   Respiratory: Negative for shortness of breath.    Cardiovascular: Negative for leg swelling.   Musculoskeletal: Negative for back pain, joint pain and joint swelling.   Skin: Positive for rash.   Neurological: Negative for numbness.              Physical Exam:    Physical Exam  Constitutional:       Appearance: Normal appearance.   Pulmonary:      Effort: Pulmonary effort is normal.   Musculoskeletal:         General: Normal range of motion.   Skin:     Comments: Scatter maculopapules on forearms, left posterior neck, and lower legs. Some in linear streaks.    Neurological:      General: No focal deficit present.      Mental Status: He is alert and oriented  to person, place, and time.              Assessment/Plan:    Problem List Items Addressed This Visit        Musculoskeletal and Integument    Lumbar herniated disc  Been going to PT x 2 months with significant relief.        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     Relevant Medications    EPINEPHrine (EPIPEN 2-PAK) 0.3 MG/0.3ML Solution Auto-injector injection, Use as directed, disp #1, 3 refill      Other Visit Diagnoses     Insect bite, unspecified site, initial encounter    -  Primary  4-6 wks hx of itchy rashes on neck, forearms, and lower legs. No associated systemic sxs. No concern for secondary bacterial infx.    Rec antihistamine such as Zyrtec qd. RTC prn rashes worsen.     Relevant Medications    triamcinolone (KENALOG) 0.1 % ointment, AAA bid, disp 30 g, 1 refill    Screening for colon cancer        Relevant Orders    Ambulatory referral to Gastroenterology                  Follow-up:    Return if symptoms worsen or fail to improve.         Anh-Phuong T Greogory Cornette, PA       Chart Review Note   I have reviewed the history and physical note and findings. I agree with plan.  Renaldo Reel, MD

## 2020-01-17 NOTE — Progress Notes (Signed)
No recent travels, no SOB, no cough, no fever, no known Covid exposure, no Covid testing no recent visits to nursing homes.

## 2020-01-24 ENCOUNTER — Ambulatory Visit
Admission: RE | Admit: 2020-01-24 | Discharge: 2020-01-24 | Disposition: A | Discharge status: Home / Self Care | Payer: No Typology Code available for payment source | Source: Ambulatory Visit

## 2020-01-24 NOTE — PT Progress Note (Signed)
Outpatient Physical Therapy Treatment Note / Discharge Note    Patient:  Jesse Berry           MRN#: [id number]  Start of care: 10/31/2019    Referring MD:Dr. Kenna Gilbert    Medical Diagnosis:Lumbar disc herniation M51.26, arthritis of left hip M16.12  PT Diagnosis: Increased pain in lumbar spine     Certification Period: 10/31/2019-12/31/2019  Treatment #: 20 of 20     TREATMENT min / units   Ther Ex 0 min   Manual Tx 30 min   Neuro re-ed 15 min     Precautions and Contraindications: None  Fall Risk: No  Allergies: No known allergies  Change in Medications: No  Change in medical status: No    Patient and therapist wore surgical masks throughout treatment session.    Subjective:     Patient reports that he saw his MD who was happy with his progression with PT and is comfortable with him being discharged.  Pain (VAS): 0/10 today.  Location: L groin     Patient's medical condition is appropriate for Physical Therapy intervention at this time.      Objective:             Objective Measures This Visit:   ROM in L/S WNL in all planes.  Core and B hip strength WNL.    Manual Treatment:  Grade II-III oscillatory flexion-rotation lumbar spine mobilizations and PA lumbar spine mobilizations to improve pain and ROM  Manual passive quadratus lumborum,hip IR, hip ER, adductor, hamstring, and iliopsoas stretch to improve flexibility  STM and MFR to lumbo-sacral musculature to improve tissue extensibility, pain level, and blood flow     Treatment Activities:  Performance of all plank exercises in new HEP to improve core strength.- see below    Pt Education:  Educated the patient on plan of care, goals of therapy, and progression of exercises.  Patient also encouraged to perform exercises regularly.    Current HEP:  Access Code: [email or web address]  URL: [email or web address]  Date: 01/24/2020  Prepared by: Cameron Ali  Exercises   Supine Figure 4 Piriformis Stretch - 1 x daily - 7 x weekly - 2 sets - 2  reps - 30 second hold   Supine Hamstring Stretch with Strap - 1 x daily - 7 x weekly - 2 sets - 2 reps - 30 second hold   Cat-Camel - 1 x daily - 7 x weekly - 1 sets - 10 reps - 10 second hold   Standing Lumbar Extension - 1 x daily - 7 x weekly - 1 sets - 10 reps - 10 second hold   Supine Sciatic Nerve Glide - 1 x daily - 7 x weekly - 1 sets - 30 reps - 1 second hold   Half Kneeling Hip Flexor Stretch - 1 x daily - 7 x weekly - 2 sets - 2 reps - 30 second hold   Dead Bug - 1 x daily - 7 x weekly - 2 sets - 10 reps - 2 second hold   Standard Plank - 1 x daily - 7 x weekly - 1 sets - 3 reps - 10 second hold   Side Plank on Elbow - 1 x daily - 7 x weekly - 2 sets - 2 reps - 10 second hold   Piriformis Mobilization on Foam Roll - 1 x daily - 7 x weekly - 10 reps - 3 sets   Supine Double Straight Leg Raise with Knee  Flexion - 1 x daily - 7 x weekly - 1 sets - 10 reps - 2 second hold   Supine 90/90 Upper and Lower Body Twist with Medicine Ball - 1 x daily - 7 x weekly - 10 reps - 3 sets   Quadruped Crawling - 1 x daily - 7 x weekly - 10 reps - 3 sets   Bird Dog on Foam 1/2 Roll - 1 x daily - 7 x weekly - 2 sets - 10 reps - 10 second hold   Single Leg Bridge - 1 x daily - 7 x weekly - 2 sets - 30 reps - 5 second hold   Clamshell with Resistance - 1 x daily - 7 x weekly - 2 sets - 30 reps   Lateral Step Down - 1 x daily - 7 x weekly - 2 sets - 30 reps   Prone Alternating Arm and Leg Lifts - 1 x daily - 7 x weekly - 2 sets - 10 reps - 10 second hold   Isometric Gluteus Medius at Wall - 1 x daily - 7 x weekly - 2 sets - 10 reps - 10 second hold   Plank with Hip Extension - 1 x daily - 7 x weekly - 3 sets - 10 reps - 60 second hold   Side Plank on Elbow with Hip Abduction - 1 x daily - 7 x weekly - 1 sets - 3 reps - 30 second hold   Full Plank with Shoulder Taps - 1 x daily - 7 x weekly - 1 sets - 3 reps - 30 second hold   Side Plank with Clam - 1 x daily - 7 x weekly - 1 sets - 3 reps - 30 second  hold   Side Plank on Elbow with Rotation - 1 x daily - 7 x weekly - 1 sets - 3 reps - 30 second hold           Assessment:     Assessment:  Patient has full functional pain-free ROM in L/S in all planes.  Patient has full core and B hip strength.  Patient has reached all of his goals.  He is independent in his HEP and is confident he will consistently perform it.  Patient is also going to start a pilates class.  Patient is being discharged at this time.    Goals:  Short Term Goals:   1. By4/16/2021, patient will be independent in HEP and symptom management: 100% met.  2. By4/16/2021, patient will report decrease pain to 3/10 on VAS at worst in order to be able tofall and stay asleep without difficulty without pain medication: 100% met.  3. By4/16/2021, patient will improve Oswestry by 90%- abandoned.    Long Term Goals:  1. By5/16/2021, patient will improve core and bilateral hip strength to 4+/5 MMT in order to be able towalk 3 miles without difficulty on even and uneven surfaces: 100% met.  2. By5/16/2021, patient will improve L/S ROM to 75% in all planes in order to be able toperform supine to/from stand without difficulty: 100% met.  3. By5/16/2021, patient will improve pain level to 1/10 on VAS at worst in order to be able tolift 20 pounds floor to waist without difficulty: 100% met.        Plan:     Therapeutic exercise, therapeutic activity, manual treatment, neuromuscular re-education, dry needling, modalities, HEP, and gait training.  Lower extremity and trunk ROM and strengthening.  Trunk, core, and  pelvic stabilization.          Cameron Ali, PT, DPT  [place] #  [id number]
